# Patient Record
Sex: Female | Born: 1935 | Race: Black or African American | Hispanic: No | Smoking: Never smoker
Health system: Southern US, Community
[De-identification: ages and names within clinical notes are randomized; demographics above are authoritative.]

## PROBLEM LIST (undated history)

## (undated) DIAGNOSIS — H269 Unspecified cataract: Secondary | ICD-10-CM

## (undated) DIAGNOSIS — M1990 Unspecified osteoarthritis, unspecified site: Secondary | ICD-10-CM

## (undated) DIAGNOSIS — T7840XA Allergy, unspecified, initial encounter: Secondary | ICD-10-CM

## (undated) DIAGNOSIS — E559 Vitamin D deficiency, unspecified: Secondary | ICD-10-CM

## (undated) DIAGNOSIS — I10 Essential (primary) hypertension: Secondary | ICD-10-CM

## (undated) DIAGNOSIS — M810 Age-related osteoporosis without current pathological fracture: Secondary | ICD-10-CM

## (undated) DIAGNOSIS — K219 Gastro-esophageal reflux disease without esophagitis: Secondary | ICD-10-CM

## (undated) HISTORY — DX: Essential (primary) hypertension: I10

## (undated) HISTORY — DX: Allergy, unspecified, initial encounter: T78.40XA

## (undated) HISTORY — PX: EYE SURGERY: SHX253

## (undated) HISTORY — DX: Age-related osteoporosis without current pathological fracture: M81.0

## (undated) HISTORY — DX: Unspecified osteoarthritis, unspecified site: M19.90

## (undated) HISTORY — DX: Unspecified cataract: H26.9

## (undated) HISTORY — PX: CHOLECYSTECTOMY: SHX55

## (undated) HISTORY — DX: Vitamin D deficiency, unspecified: E55.9

## (undated) HISTORY — DX: Gastro-esophageal reflux disease without esophagitis: K21.9

---

## 2000-07-17 ENCOUNTER — Other Ambulatory Visit: Admission: RE | Admit: 2000-07-17 | Discharge: 2000-07-17 | Payer: Self-pay | Admitting: Family Medicine

## 2002-01-07 ENCOUNTER — Other Ambulatory Visit: Admission: RE | Admit: 2002-01-07 | Discharge: 2002-01-07 | Payer: Self-pay | Admitting: Family Medicine

## 2002-10-01 ENCOUNTER — Ambulatory Visit (HOSPITAL_COMMUNITY): Admission: RE | Admit: 2002-10-01 | Discharge: 2002-10-01 | Payer: Self-pay | Admitting: Gastroenterology

## 2003-09-28 ENCOUNTER — Observation Stay (HOSPITAL_COMMUNITY): Admission: RE | Admit: 2003-09-28 | Discharge: 2003-09-29 | Payer: Self-pay | Admitting: Surgery

## 2006-07-11 IMAGING — RF DG CHOLANGIOGRAM OPERATIVE
1 series · 16 of 16 positions shown · non-contrast
Comparison: none

CLINICAL DATA: Cholelithiasis; laparoscopic cholecystectomy
INTRAOPERATIVE CHOLANGIOGRAPHY
A sequence of images from the intraoperative cholangiogram demonstrates excellent opacification of the common bile duct, common hepatic duct, and proximal intrahepatic ducts.  No filling defects are identified to suggest retained stones.  There is excellent antegrade flow into the duodenum.  
IMPRESSION
Normal intraoperative cholangiography.

[Series 1: run · 16 of 16 slices shown]
[im 1/16]
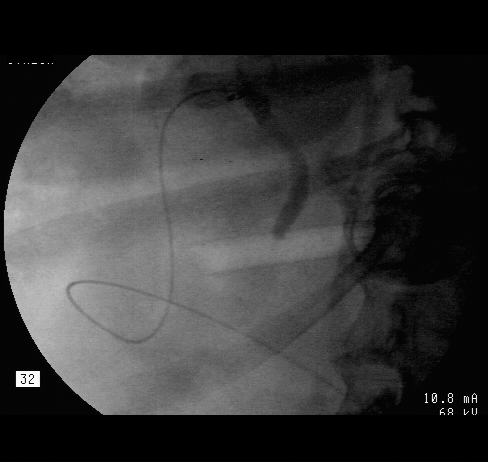
[im 2/16]
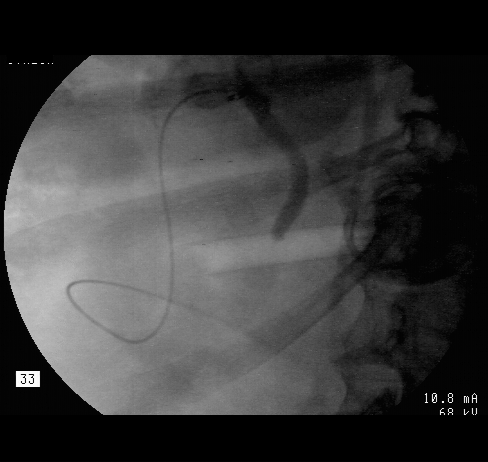
[im 3/16]
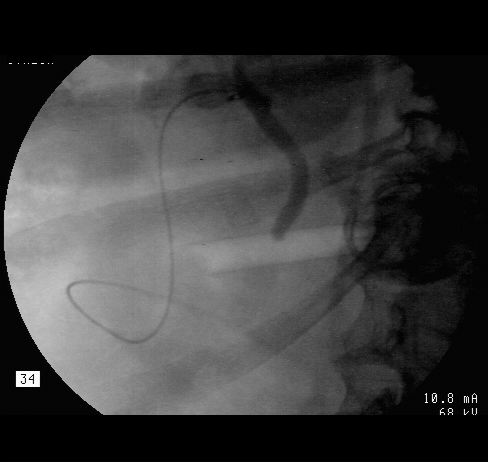
[im 4/16]
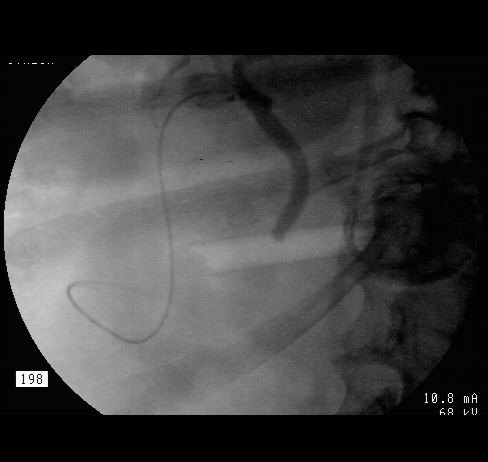
[im 5/16]
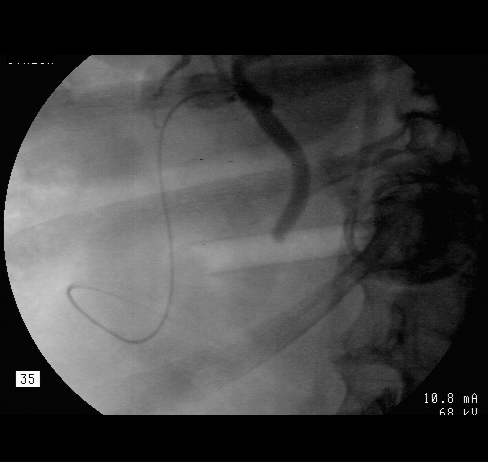
[im 6/16]
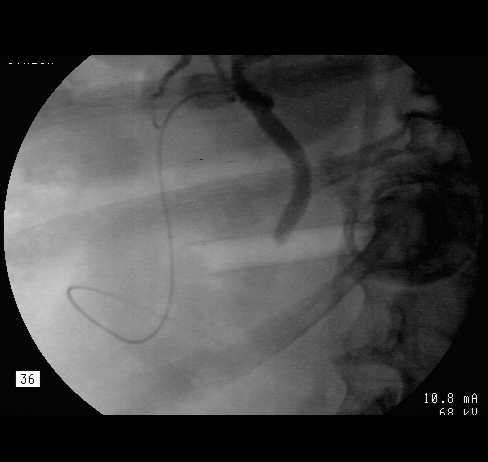
[im 7/16]
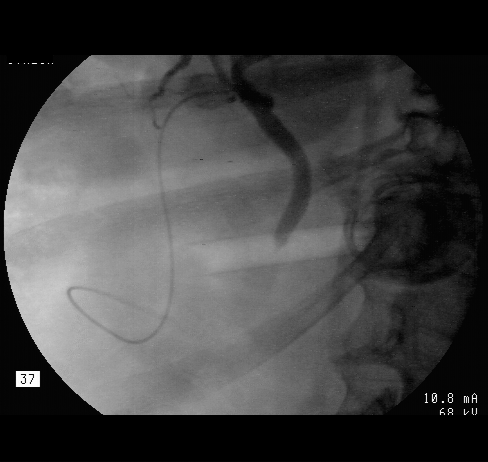
[im 8/16]
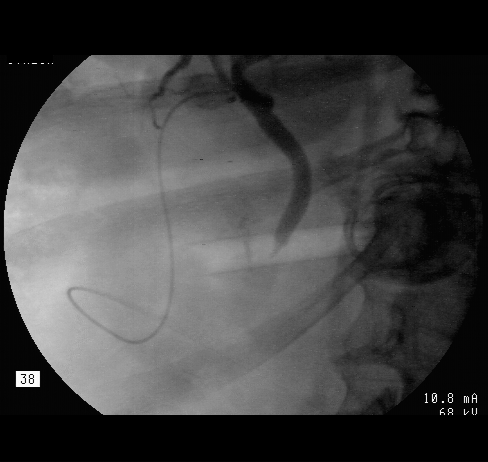
[im 9/16]
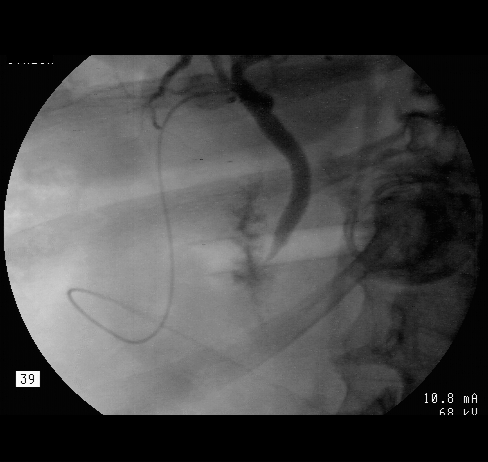
[im 10/16]
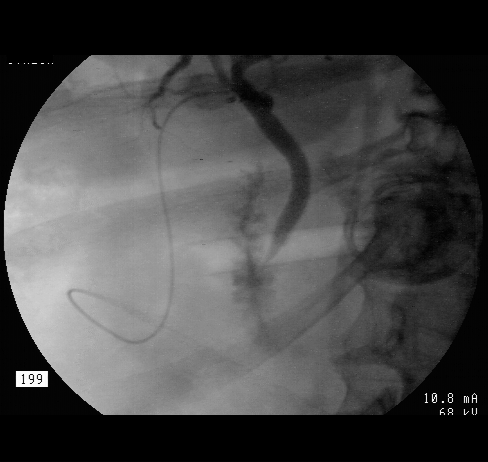
[im 11/16]
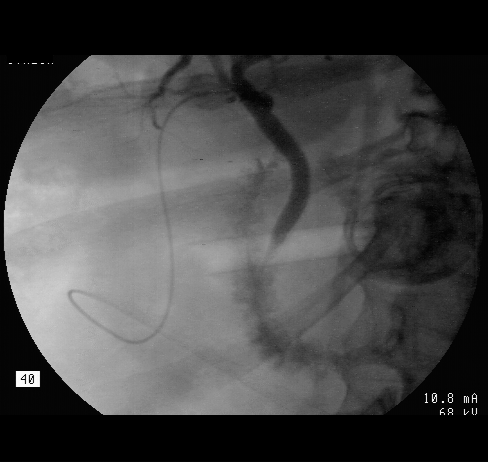
[im 12/16]
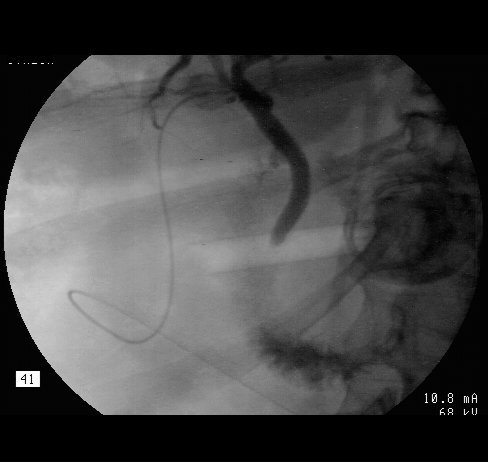
[im 13/16]
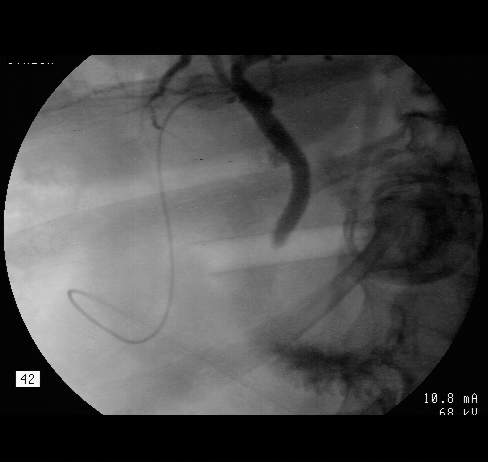
[im 14/16]
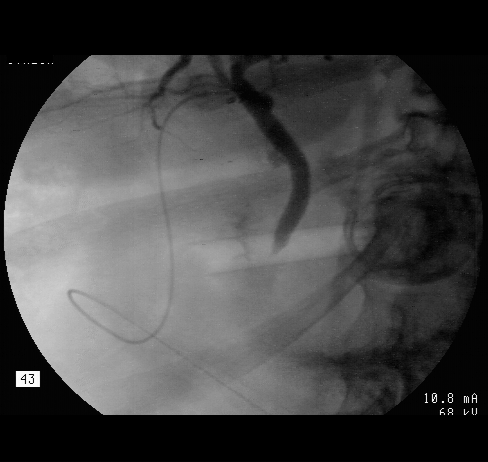
[im 15/16]
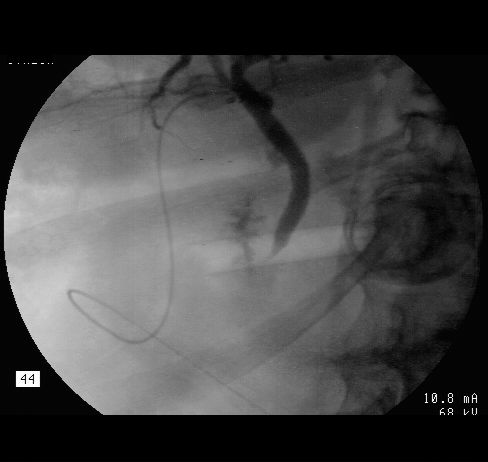
[im 16/16]
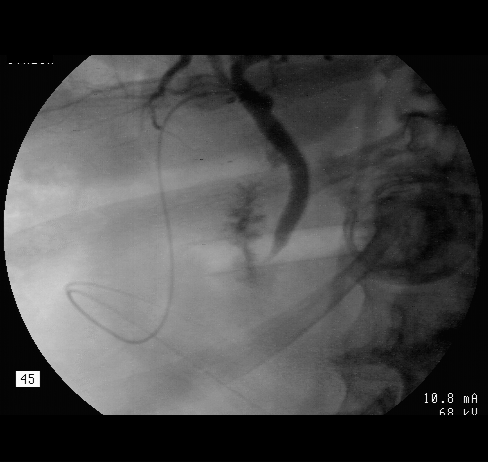

[16 of 16 positions shown; findings below may reference images not displayed]

## 2012-07-31 ENCOUNTER — Other Ambulatory Visit: Payer: Self-pay | Admitting: Family Medicine

## 2012-08-07 ENCOUNTER — Ambulatory Visit
Admission: RE | Admit: 2012-08-07 | Discharge: 2012-08-07 | Disposition: A | Discharge status: Home / Self Care | Payer: Medicare Other | Source: Ambulatory Visit | Attending: Family Medicine | Admitting: Family Medicine

## 2013-01-15 ENCOUNTER — Ambulatory Visit: Payer: Self-pay | Admitting: Neurology

## 2013-06-05 ENCOUNTER — Ambulatory Visit (INDEPENDENT_AMBULATORY_CARE_PROVIDER_SITE_OTHER): Payer: Medicare Other | Admitting: Family Medicine

## 2013-06-05 VITALS — BP 138/82 | HR 92 | Temp 97.9°F | Resp 18 | Ht 62.5 in | Wt 214.0 lb

## 2013-06-05 DIAGNOSIS — J209 Acute bronchitis, unspecified: Secondary | ICD-10-CM

## 2013-06-05 DIAGNOSIS — J329 Chronic sinusitis, unspecified: Secondary | ICD-10-CM

## 2013-06-05 DIAGNOSIS — J069 Acute upper respiratory infection, unspecified: Secondary | ICD-10-CM

## 2013-06-05 DIAGNOSIS — J029 Acute pharyngitis, unspecified: Secondary | ICD-10-CM

## 2013-06-05 MED ORDER — AMOXICILLIN 875 MG PO TABS: 875.0000 mg | ORAL_TABLET | Freq: Two times a day (BID) | ORAL | Status: DC

## 2013-06-05 NOTE — Progress Notes (Signed)
This chart was scribed for Elvina Sidle, MD by Nicholos Johns, Medical Scribe. This patient's care was started at 2:33 PM  Patient ID: Melissa Arnold MRN: [id number], DOB: 03-31-1933, 80 y.o. Date of Encounter: 06/05/2013, 2:33 PM  Primary Physician: No primary provider on file.  Chief Complaint: cough  HPI: 80 y.o. year old female with history below presents with sore throat, HA, rhinorrhea, cough, postnasal drip, congestion, SOB, onset 3 days ago. Is able to sleep but does report waking up when she starts coughing. Pt went to see a doctor on 04/24/13 with similar symptoms and was given Merinax and nose drops that resolved most of the symptoms; did not have a cough at that time. Was told she did not have any infections. 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 Has been in [place] for over 10 years. Pt has 6 children, 10 grandchildren, and 3 great grandchildren.   Past Medical History  Diagnosis Date   Arthritis    Cataract    Hypertension      Home Meds: Prior to Admission medications   Medication Sig Start Date End Date Taking? Authorizing Provider  hydrochlorothiazide (HYDRODIURIL) 25 MG tablet Take 25 mg by mouth daily.   Yes Historical Provider, MD  quinapril (ACCUPRIL) 40 MG tablet Take 40 mg by mouth at bedtime.   Yes Historical Provider, MD    Allergies: No Known Allergies  History   Social History   Marital Status: Married    Spouse Name: N/A    Number of Children: N/A   Years of Education: N/A   Occupational History   Not on file.   Social History Main Topics   Smoking status: Never Smoker    Smokeless tobacco: Not on file   Alcohol Use: No   Drug Use: Not on file   Sexual Activity: Not on file   Other Topics Concern   Not on file   Social History Narrative   No narrative on file     Review of Systems: Constitutional: negative for chills, fever, night sweats, weight changes, or fatigue  HEENT:  negative for vision changes, hearing loss, ST, epistaxis, or sinus pressure. Positive for sore throat, congestion, rhinorrhea, and postnasal drip Cardiovascular: negative for chest pain or palpitations Respiratory: negative for hemoptysis, wheezing. Positive for SOB and cough. Abdominal: negative for abdominal pain, nausea, vomiting, diarrhea, or constipation Dermatological: negative for rash Neurologic: negative for dizziness, or syncope. Positive for headache. All other systems reviewed and are otherwise negative with the exception to those above and in the HPI.   Physical Exam: Blood pressure 138/82, pulse 92, temperature 97.9 F (36.6 C), temperature source Oral, resp. rate 18, height 5' 2.5" (1.588 m), weight 214 lb (97.07 kg), SpO2 97.00%., Body mass index is 38.49 kg/(m^2). General: Well developed, well nourished, in no acute distress. Head: Normocephalic, atraumatic, eyes without discharge, sclera non-icteric, nares are without discharge. Bilateral auditory canals clear, TM's are without perforation, pearly grey and translucent with reflective cone of light bilaterally. Oral cavity moist, posterior pharynx without exudate, erythema, peritonsillar abscess, or post nasal drip.  Neck: Supple. No thyromegaly. Full ROM. No lymphadenopathy. Lungs: Clear bilaterally to auscultation without wheezes, rales, or rhonchi. Breathing is unlabored. Heart: RRR with S1 S2. No murmurs, rubs, or gallops appreciated. Abdomen: Soft, non-tender, non-distended with normoactive bowel sounds. No hepatomegaly. No rebound/guarding. No obvious abdominal masses. Msk:  Strength and tone normal for age. Extremities/Skin: Warm and dry. No clubbing  or cyanosis. No edema. No rashes or suspicious lesions. Neuro: Alert and oriented X 3. Moves all extremities spontaneously. Gait is normal. CNII-XII grossly in tact. Psych:  Responds to questions appropriately with a normal affect.    ASSESSMENT AND PLAN:  80 y.o. year  old female with Sinusitis - Plan: amoxicillin (AMOXIL) 875 MG tablet  Acute bronchitis - Plan: amoxicillin (AMOXIL) 875 MG tablet  Acute pharyngitis - Plan: amoxicillin (AMOXIL) 875 MG tablet     Signed, Elvina Sidle, MD 06/05/2013 2:33 PM

## 2015-06-12 ENCOUNTER — Encounter (HOSPITAL_COMMUNITY): Payer: Self-pay | Admitting: Emergency Medicine

## 2015-06-12 ENCOUNTER — Emergency Department (HOSPITAL_COMMUNITY)
Admission: EM | Admit: 2015-06-12 | Discharge: 2015-06-13 | Disposition: A | Discharge status: Home / Self Care | Payer: Medicare Other | Attending: Emergency Medicine | Admitting: Emergency Medicine

## 2015-06-12 ENCOUNTER — Emergency Department (HOSPITAL_COMMUNITY): Payer: Medicare Other

## 2015-06-12 DIAGNOSIS — R079 Chest pain, unspecified: Secondary | ICD-10-CM

## 2015-06-12 DIAGNOSIS — R61 Generalized hyperhidrosis: Secondary | ICD-10-CM | POA: Insufficient documentation

## 2015-06-12 DIAGNOSIS — M1990 Unspecified osteoarthritis, unspecified site: Secondary | ICD-10-CM | POA: Diagnosis not present

## 2015-06-12 DIAGNOSIS — I10 Essential (primary) hypertension: Secondary | ICD-10-CM | POA: Insufficient documentation

## 2015-06-12 DIAGNOSIS — Z8669 Personal history of other diseases of the nervous system and sense organs: Secondary | ICD-10-CM | POA: Diagnosis not present

## 2015-06-12 DIAGNOSIS — Z79899 Other long term (current) drug therapy: Secondary | ICD-10-CM | POA: Insufficient documentation

## 2015-06-12 LAB — CBC
HEMATOCRIT: 37.5 % (ref 36.0–46.0)
HEMOGLOBIN: 12.6 g/dL (ref 12.0–15.0)
MCH: 26.5 pg (ref 26.0–34.0)
MCHC: 33.6 g/dL (ref 30.0–36.0)
MCV: 78.8 fL (ref 78.0–100.0)
Platelets: 226 10*3/uL (ref 150–400)
RBC: 4.76 MIL/uL (ref 3.87–5.11)
RDW: 14.7 % (ref 11.5–15.5)
WBC: 7.9 10*3/uL (ref 4.0–10.5)

## 2015-06-12 LAB — I-STAT TROPONIN, ED: Troponin i, poc: 0 ng/mL (ref 0.00–0.08)

## 2015-06-12 LAB — BASIC METABOLIC PANEL
Anion gap: 10 (ref 5–15)
BUN: 15 mg/dL (ref 6–20)
CALCIUM: 9.8 mg/dL (ref 8.9–10.3)
CHLORIDE: 101 mmol/L (ref 101–111)
CO2: 29 mmol/L (ref 22–32)
CREATININE: 1.04 mg/dL — AB (ref 0.44–1.00)
GFR calc non Af Amer: 49 mL/min — ABNORMAL LOW (ref >60)
GFR, EST AFRICAN AMERICAN: 57 mL/min — AB (ref >60)
Glucose, Bld: 104 mg/dL — ABNORMAL HIGH (ref 65–99)
Potassium: 3.9 mmol/L (ref 3.5–5.1)
SODIUM: 140 mmol/L (ref 135–145)

## 2015-06-12 NOTE — ED Notes (Signed)
Patient presents for right sided chest pain x2 days. Reports episode of chest pain described as burning and diaphoresis x2 episodes today. Denies SOB, lightheadedness, dizziness, N/V, shoulder or back pain. A&O x4. Ambulatory to registration. Rates pain 5/10.

## 2015-06-13 NOTE — Discharge Instructions (Signed)
Nonspecific Chest Pain Melissa Arnold, your blood work, EKG, and chest xray today were normal.  You need to see your primary care doctor within 3 days for close follow up.  If any of your symptoms return, come back to the ED immediately. Thank you. It is often hard to find the cause of chest pain. There is always a chance that your pain could be related to something serious, such as a heart attack or a blood clot in your lungs. Chest pain can also be caused by conditions that are not life-threatening. If you have chest pain, it is very important to follow up with your doctor.  HOME CARE  If you were prescribed an antibiotic medicine, finish it all even if you start to feel better.  Avoid any activities that cause chest pain.  Do not use any tobacco products, including cigarettes, chewing tobacco, or electronic cigarettes. If you need help quitting, ask your doctor.  Do not drink alcohol.  Take medicines only as told by your doctor.  Keep all follow-up visits as told by your doctor. This is important. This includes any further testing if your chest pain does not go away.  Your doctor may tell you to keep your head raised (elevated) while you sleep.  Make lifestyle changes as told by your doctor. These may include:  Getting regular exercise. Ask your doctor to suggest some activities that are safe for you.  Eating a heart-healthy diet. Your doctor or a diet specialist (dietitian) can help you to learn healthy eating options.  Maintaining a healthy weight.  Managing diabetes, if necessary.  Reducing stress. GET HELP IF:  Your chest pain does not go away, even after treatment.  You have a rash with blisters on your chest.  You have a fever. GET HELP RIGHT AWAY IF:  Your chest pain is worse.  You have an increasing cough, or you cough up blood.  You have severe belly (abdominal) pain.  You feel extremely weak.  You pass out (faint).  You have chills.  You have sudden,  unexplained chest discomfort.  You have sudden, unexplained discomfort in your arms, back, neck, or jaw.  You have shortness of breath at any time.  You suddenly start to sweat, or your skin gets clammy.  You feel nauseous.  You vomit.  You suddenly feel light-headed or dizzy.  Your heart begins to beat quickly, or it feels like it is skipping beats. These symptoms may be an emergency. Do not wait to see if the symptoms will go away. Get medical help right away. Call your local emergency services (911 in the [place] Do not drive yourself to the hospital.   This information is not intended to replace advice given to you by your health care provider. Make sure you discuss any questions you have with your health care provider.   Document Released: 08/21/2007 Document Revised: 03/25/2014 Document Reviewed: 10/08/2013 Elsevier Interactive Patient Education [employer]

## 2015-06-13 NOTE — ED Provider Notes (Addendum)
CSN: [id number]     Arrival date & time 06/12/15  2117 History  By signing my name below, I, Rohini Rajnarayanan, attest that this documentation has been prepared under the direction and in the presence of Tomasita Crumble, MD Electronically Signed: Charlean Merl, ED Scribe 06/13/2015 at 12:19 AM.   Chief Complaint  Patient presents with  . Chest Pain   The history is provided by the patient. No language interpreter was used.   HPI Comments: Melissa Arnold is a 82 y.o. female who presents to the Emergency Department complaining of right sided, hot, burning, mild, intermittent, short-lived, 5/10, right sided CP, and diaphoresis which occurred 2x today. Pt was sitting down, waiting for a prescription to be filled at the drug store earlier today, when the first episode occurred. Then, after driving home, she experienced the second episode around 3 hours later. Pt is experiencing no pain at this time. Pt states no exacerbating or ameliorating factors. Pt admits that she may have a pmhx of heart burn. She denies any emesis, rhinorrhea, back pain, fever, sick contacts, or recent sickness. Pt has no hx of heart problems. Pt does not have a cardiologist at this time. There was no SOB or emesis associated.  Past Medical History  Diagnosis Date  . Arthritis   . Cataract   . Hypertension    Past Surgical History  Procedure Laterality Date  . Cholecystectomy    . Eye surgery     Family History  Problem Relation Age of Onset  . Hypertension Father   . Heart disease Brother    Social History  Substance Use Topics  . Smoking status: Never Smoker   . Smokeless tobacco: None  . Alcohol Use: No   OB History    No data available     Review of Systems  10 Systems reviewed and all are negative for acute change except as noted in the HPI.  Allergies  Review of patient's allergies indicates no known allergies.  Home Medications   Prior to Admission medications   Medication Sig Start Date  End Date Taking? Authorizing Provider  Aspirin-Salicylamide-Caffeine (BC HEADACHE POWDER PO) Take 1 each by mouth daily as needed (pain).   Yes Historical Provider, MD  hydrochlorothiazide (HYDRODIURIL) 25 MG tablet Take 25 mg by mouth daily.   Yes Historical Provider, MD  quinapril (ACCUPRIL) 20 MG tablet Take 20 mg by mouth 2 (two) times daily. 03/16/15  Yes Historical Provider, MD   BP 163/100 mmHg  Pulse 75  Temp(Src) 98.2 F (36.8 C) (Oral)  Resp 16  SpO2 98% Physical Exam  Constitutional: She is oriented to person, place, and time. She appears well-developed and well-nourished. No distress.  HENT:  Head: Normocephalic and atraumatic.  Nose: Nose normal.  Mouth/Throat: Oropharynx is clear and moist. No oropharyngeal exudate.  Eyes: Conjunctivae and EOM are normal. Pupils are equal, round, and reactive to light. No scleral icterus.  Neck: Normal range of motion. Neck supple. No JVD present. No tracheal deviation present. No thyromegaly present.  Cardiovascular: Normal rate, regular rhythm and normal heart sounds.  Exam reveals no gallop and no friction rub.   No murmur heard. Pulmonary/Chest: Effort normal and breath sounds normal. No respiratory distress. She has no wheezes. She exhibits no tenderness.  Abdominal: Soft. Bowel sounds are normal. She exhibits no distension and no mass. There is no tenderness. There is no rebound and no guarding.  Musculoskeletal: Normal range of motion. She exhibits no edema or tenderness.  Lymphadenopathy:  She has no cervical adenopathy.  Neurological: She is alert and oriented to person, place, and time. No cranial nerve deficit. She exhibits normal muscle tone.  Skin: Skin is warm and dry. No rash noted. No erythema. No pallor.  Nursing note and vitals reviewed.   ED Course  Procedures  DIAGNOSTIC STUDIES: Oxygen Saturation is 98% on RA, normal by my interpretation.    COORDINATION OF CARE: 12:16 AM-Discussed treatment plan which  includes DG Chest, blood work, cardiac monitoring, EKG, and Troponin, with pt at bedside and pt agreed to plan.   Labs Review Labs Reviewed  BASIC METABOLIC PANEL - Abnormal; Notable for the following:    Glucose, Bld 104 (*)    Creatinine, Ser 1.04 (*)    GFR calc non Af Amer 49 (*)    GFR calc Af Amer 57 (*)    All other components within normal limits  CBC  I-STAT TROPOININ, ED    Imaging Review Dg Chest 2 View  06/12/2015  CLINICAL DATA:  Chest pain EXAM: CHEST  2 VIEW COMPARISON:  09/26/2003 chest radiograph. FINDINGS: Stable cardiomediastinal silhouette with normal heart size and mildly tortuous thoracic aorta. No pneumothorax. No pleural effusion. Lungs appear clear, with no acute consolidative airspace disease and no pulmonary edema. Stable small eventrations in the anterior hemidiaphragms bilaterally. IMPRESSION: No active cardiopulmonary disease. Electronically Signed   By: Delbert Phenix M.D.   On: 06/12/2015 21:50   I have personally reviewed and evaluated these images and lab results as part of my medical decision-making.   EKG Interpretation   Date/Time:  Monday June 12 2015 21:26:26 EDT Ventricular Rate:  75 PR Interval:  226 QRS Duration: 89 QT Interval:  393 QTC Calculation: 439 R Axis:   -31 Text Interpretation:  Sinus rhythm Prolonged PR interval Abnormal R-wave  progression, late transition Baseline wander in lead(s) V3 No significant  change since last tracing Confirmed by Erroll Luna [id number]) on  06/12/2015 11:26:42 PM      MDM   Final diagnoses:  None   Patient presents to the ED for chest pain. Her history is not consistent with ACS. She has been experiencing this for 2 days and troponin today is still negative.  EKG is unchanged from previous and CXR is unremarkable.  She currently has no symptoms and no cardiac history.  She appears well and in NAD.  She has an appt with PCP in 2 days for close follow up.  Strict return precautions given.  VS  remain within her normal limits and she is safe for DC.   I personally performed the services described in this documentation, which was scribed in my presence. The recorded information has been reviewed and is accurate.       Tomasita Crumble, MD 06/13/15 [id number]  Tomasita Crumble, MD 06/22/15 [phone]

## 2015-06-19 ENCOUNTER — Encounter: Payer: Self-pay | Admitting: *Deleted

## 2015-06-19 ENCOUNTER — Encounter: Payer: Self-pay | Admitting: Physician Assistant

## 2015-06-19 ENCOUNTER — Ambulatory Visit (INDEPENDENT_AMBULATORY_CARE_PROVIDER_SITE_OTHER): Payer: Medicare Other | Admitting: Physician Assistant

## 2015-06-19 VITALS — BP 115/60 | HR 88 | Ht 62.5 in | Wt 224.8 lb

## 2015-06-19 DIAGNOSIS — R0789 Other chest pain: Secondary | ICD-10-CM

## 2015-06-19 DIAGNOSIS — I10 Essential (primary) hypertension: Secondary | ICD-10-CM

## 2015-06-19 NOTE — Progress Notes (Signed)
Cardiology Office Note   Date:  06/19/2015   ID:  Melissa Arnold, DOB [birth year], MRN [id number]  PCP:  Mickie Hillier, MD  Cardiologist:  Dr. Eldridge Dace  Chief Complaint  Patient presents with  . Follow-up    seen for Dr. Eldridge Dace, chest pain      History of Present Illness: Melissa Arnold is a 82 y.o. female who presents for post hospital follow-up. The patient was seen on 06/13/3015 in the emergency room for evaluation of chest pain. According to the patient, she was seen by Dr. Eldridge Dace in August 2014 for evaluation of chest pain, a chemical stress test was done the time which was negative.   She was in her usual status of health until earlier past week, her husband recently was discharged after being admitted for one week. She was under a lot of stress. Last Monday, while waiting in the pharmacy, she has significant diaphoresis accompanied by substernal chest pain lasted about a minute. After going home, she had another episode of chest pain which also lasted roughly 1 minute with significant diaphoresis. She sought medical attention at the walk-in clinic at [facility]. She was sent to the [facility] ED. Workup was negative at the time she was discharged home. She was seen by her PCP last Thursday who recommended follow-up with cardiology for further evaluation. She presented today for cardiology evaluation, she denies any recurrent chest discomfort since last Monday. She is fairly active at home, however cannot walk on treadmill due to her knee problem. EKG obtained today show normal sinus rhythm without significant ST-T wave changes. Her chest discomfort is somewhat atypical, unfortunate she cannot walk on the treadmill at this time, we will order a [employer].     Past Medical History  Diagnosis Date  . Arthritis   . Cataract   . Hypertension   . GERD (gastroesophageal reflux disease)   . Vitamin D deficiency     Past Surgical History  Procedure  Laterality Date  . Cholecystectomy    . Eye surgery       Current Outpatient Prescriptions  Medication Sig Dispense Refill  . aspirin EC 81 MG tablet Take 81 mg by mouth daily.    . Aspirin-Salicylamide-Caffeine (BC HEADACHE POWDER PO) Take 1 each by mouth daily as needed (pain).    . hydrochlorothiazide (HYDRODIURIL) 25 MG tablet Take 25 mg by mouth daily.    . quinapril (ACCUPRIL) 20 MG tablet Take 20 mg by mouth 2 (two) times daily.  1  . traMADol (ULTRAM) 50 MG tablet Take 50 mg by mouth every 12 (twelve) hours as needed for moderate pain.      No current facility-administered medications for this visit.    Allergies:   Gabapentin    Social History:  The patient  reports that she has never smoked. She does not have any smokeless tobacco history on file. She reports that she does not drink alcohol or use illicit drugs.   Family History:  The patient's family history includes Asthma in her mother; Heart disease in her brother; Heart failure in her father and mother; Hypertension in her father. There is no history of Heart attack or Stroke.    ROS:  Please see the history of present illness.   Otherwise, review of systems are positive for Intermittent chest discomfort, diaphoresis.   All other systems are reviewed and negative.    PHYSICAL EXAM: VS:  BP 115/60 mmHg  Pulse 88  Ht  5' 2.5" (1.588 m)  Wt 224 lb 12.8 oz (101.969 kg)  BMI 40.44 kg/m2 , BMI Body mass index is 40.44 kg/(m^2). GEN: Well nourished, well developed, in no acute distress HEENT: normal Neck: no JVD, carotid bruits, or masses Cardiac: RRR; no murmurs, rubs, or gallops,no edema  Respiratory:  clear to auscultation bilaterally, normal work of breathing GI: soft, nontender, nondistended, + BS MS: no deformity or atrophy Skin: warm and dry, no rash Neuro:  Strength and sensation are intact Psych: euthymic mood, full affect   EKG:  EKG is ordered today. The ekg ordered today demonstrates normal sinus  rhythm without significant ST-T wave changes   Recent Labs: 06/12/2015: BUN 15; Creatinine, Ser 1.04*; Hemoglobin 12.6; Platelets 226; Potassium 3.9; Sodium 140    Lipid Panel No results found for: CHOL, TRIG, HDL, CHOLHDL, VLDL, LDLCALC, LDLDIRECT    Wt Readings from Last 3 Encounters:  06/19/15 224 lb 12.8 oz (101.969 kg)  06/05/13 214 lb (97.07 kg)      Other studies Reviewed: Additional studies/ records that were reviewed today include:   Office note by Dr. Catha Gosselin.  . Review of the above records demonstrates:   Recent negative ED workup, EKG showedsignificant ST-T wave changes.    ASSESSMENT AND PLAN:  1.  Atypical chest discomfort: No obvious correlation with exertion, unclear what is the cause of her symptom, her EKG has not shown significant changes, per her report she had a negative Myoview in 2014. Unfortunately she could not ambulate on the treadmill, we'll obtain outpatient Myoview, if negative, would not expect any further workup. Add daily baby aspirin.  2. Hypertension: Blood pressure well-controlled on home medication.    Current medicines are reviewed at length with the patient today.  The patient does not have concerns regarding medicines.  The following changes have been made:  no change  Labs/ tests ordered today include:   Orders Placed This Encounter  Procedures  . Myocardial Perfusion Imaging  . EKG 12-Lead     Disposition:   FU with cardiology depend on Myoview result, he Myoview negative, and follow-up with cardiology on an as-needed basis, if positive, she will need closer follow-up.    Ramond Dial, [place]  06/19/2015 6:57 PM    [facility] [address] [place], [place], [place]  [address] Phone: [phone]; Fax: [phone]

## 2015-06-19 NOTE — Patient Instructions (Addendum)
Medication Instructions:   START TAKING ASPRIN 81 MG ONCE A DAY   If you need a refill on your cardiac medications before your next appointment, please call your pharmacy.  Labwork:  NONE ORDER TODAY    Testing/Procedures:  Your physician has requested that you have a lexiscan myoview. For further information please visit [email or web address] Please follow instruction sheet, as given.     Follow-Up:  BASED UPON RESULTS WILL FOLLOW UP WITH AN APP     Any Other Special Instructions Will Be Listed Below (If Applicable).

## 2015-06-22 ENCOUNTER — Telehealth (HOSPITAL_COMMUNITY): Payer: Self-pay | Admitting: *Deleted

## 2015-06-22 ENCOUNTER — Telehealth (HOSPITAL_COMMUNITY): Payer: Self-pay | Admitting: Radiology

## 2015-06-22 NOTE — Telephone Encounter (Signed)
Patient given detailed instructions per Myocardial Perfusion Study Information Sheet for the test on 06/27/2015 at 7:45. Patient notified to arrive 15 minutes early and that it is imperative to arrive on time for appointment to keep from having the test rescheduled.  If you need to cancel or reschedule your appointment, please call the office within 24 hours of your appointment. Failure to do so may result in a cancellation of your appointment, and a $50 no show fee. Patient verbalized understanding.EHK

## 2015-06-22 NOTE — Telephone Encounter (Signed)
Left message on voicemail in reference to upcoming appointment scheduled for 06/27/15. Phone number given for a call back so details instructions can be given. Melissa Arnold J [name], RN 

## 2015-06-26 ENCOUNTER — Telehealth (HOSPITAL_COMMUNITY): Payer: Self-pay | Admitting: Radiology

## 2015-06-26 ENCOUNTER — Telehealth (HOSPITAL_COMMUNITY): Payer: Self-pay | Admitting: *Deleted

## 2015-06-26 NOTE — Telephone Encounter (Signed)
Patient given detailed instructions per Myocardial Perfusion Study Information Sheet for the test on 06/28/2015 at 7:45. Patient notified to arrive 15 minutes early and that it is imperative to arrive on time for appointment to keep from having the test rescheduled.  If you need to cancel or reschedule your appointment, please call the office within 24 hours of your appointment. Failure to do so may result in a cancellation of your appointment, and a $50 no show fee. Patient verbalized understanding.EHK

## 2015-06-26 NOTE — Telephone Encounter (Signed)
Left message on voicemail in reference to upcoming appointment scheduled for 06/27/15. Phone number given for a call back so details instructions can be given. Melissa Arnold, Melissa Arnold

## 2015-06-27 ENCOUNTER — Ambulatory Visit (HOSPITAL_COMMUNITY): Payer: Medicare Other | Attending: Cardiology

## 2015-06-27 DIAGNOSIS — R0789 Other chest pain: Secondary | ICD-10-CM | POA: Insufficient documentation

## 2015-06-27 DIAGNOSIS — I10 Essential (primary) hypertension: Secondary | ICD-10-CM | POA: Insufficient documentation

## 2015-06-27 MED ORDER — REGADENOSON 0.4 MG/5ML IV SOLN
0.4000 mg | Freq: Once | INTRAVENOUS | Status: AC
  Administered 2015-06-27: 0.4 mg via INTRAVENOUS

## 2015-06-27 MED ORDER — TECHNETIUM TC 99M SESTAMIBI GENERIC - CARDIOLITE
33.0000 | Freq: Once | INTRAVENOUS | Status: AC | PRN
  Administered 2015-06-27: 33 via INTRAVENOUS

## 2015-06-28 ENCOUNTER — Ambulatory Visit (HOSPITAL_COMMUNITY): Payer: Medicare Other | Attending: Cardiovascular Disease

## 2015-06-28 LAB — MYOCARDIAL PERFUSION IMAGING
CHL CUP NUCLEAR SRS: 7
CSEPPHR: 93 {beats}/min
LV dias vol: 61 mL (ref 46–106)
LV sys vol: 10 mL
NUC STRESS TID: 1.16
RATE: 0.31
Rest HR: 81 {beats}/min
SDS: 4
SSS: 11

## 2015-06-28 MED ORDER — TECHNETIUM TC 99M SESTAMIBI GENERIC - CARDIOLITE
32.5000 | Freq: Once | INTRAVENOUS | Status: AC | PRN
  Administered 2015-06-28: 33 via INTRAVENOUS

## 2015-12-20 ENCOUNTER — Ambulatory Visit (INDEPENDENT_AMBULATORY_CARE_PROVIDER_SITE_OTHER): Payer: Medicare Other | Admitting: Family Medicine

## 2015-12-20 VITALS — BP 148/76 | HR 102 | Temp 97.9°F | Resp 20 | Ht 61.0 in | Wt 225.2 lb

## 2015-12-20 DIAGNOSIS — J209 Acute bronchitis, unspecified: Secondary | ICD-10-CM | POA: Diagnosis not present

## 2015-12-20 DIAGNOSIS — J309 Allergic rhinitis, unspecified: Secondary | ICD-10-CM | POA: Insufficient documentation

## 2015-12-20 MED ORDER — FLUTICASONE PROPIONATE 50 MCG/ACT NA SUSP: 2.0000 | Freq: Every day | NASAL | 11 refills | Status: DC

## 2015-12-20 MED ORDER — AZITHROMYCIN 250 MG PO TABS: ORAL_TABLET | ORAL | 0 refills | Status: DC

## 2015-12-20 NOTE — Progress Notes (Signed)
   HPI  Patient presents today here with cough abd congestion  Pt c/o mild dyspnea and productive cough for 3-4 weeks. Preceded by congestion, post nasal drip, and frequent throat clearing.   Tolerating foods and fluids well, denies chest pain.   She denies sinus pain or pressure  PMH: Smoking status noted ROS: Per HPI  Objective: BP (!) 148/76 (BP Location: Left Arm, Patient Position: Sitting, Cuff Size: Large)   Pulse (!) 102   Temp 97.9 F (36.6 C) (Oral)   Resp 20   Ht 5\' 1"  (1.549 m)   Wt 225 lb 3.2 oz (102.2 kg)   SpO2 97%   BMI 42.55 kg/m  Gen: NAD, alert, cooperative with exam HEENT: NCAT, Tms WNL BL, no sinus tenderness to palp, oropoharynx clear, nares with swollen turbinates BL CV: RRR, good S1/S2, no murmur Resp: CTABL, no wheezes, non-labored Ext: No edema, warm Neuro: Alert and oriented, No gross deficits  Assessment and plan:  # Acute bronchitis, allergic rhinitis Covering cough with azithromycin, although her lung exam is reassuring and I think most of her symptoms are due to post nasal drip Maximize sinus treatment with zyrtec+ Flonase, consider singulair if needed Mucinex DM for cough as well.  Avoid decongestants.   RTC with any concerns  Meds ordered this encounter  Medications  . azithromycin (ZITHROMAX) 250 MG tablet    Sig: Take 2 tablets on day 1 and 1 tablet daily after that    Dispense:  6 tablet    Refill:  0  . fluticasone (FLONASE) 50 MCG/ACT nasal spray    Sig: Place 2 sprays into both nostrils daily.    Dispense:  16 g    Refill:  11    , MD 1:11 PM

## 2015-12-20 NOTE — Patient Instructions (Addendum)
Great to meet you!  Start taking zyrtec once daily (not zyrtec D, plain zyrtec, generic is ok)  Start flonase daily. You can keep going with this after you get better if you can see it is helping  Try Mucinex DM 12 hour  Take all of the antibiotics, they last in your system for 10 days.       IF you received an x-ray today, you will receive an invoice from [facility]. Please contact [facility] at [phone] with questions or concerns regarding your invoice.   IF you received labwork today, you will receive an invoice from [employer]. Please contact [facility] at [phone] with questions or concerns regarding your invoice.   Our billing staff will not be able to assist you with questions regarding bills from these companies.  You will be contacted with the lab results as soon as they are available. The fastest way to get your results is to activate your My Chart account. Instructions are located on the last page of this paperwork. If you have not heard from [place] regarding the results in 2 weeks, please contact this office.

## 2016-02-06 ENCOUNTER — Other Ambulatory Visit: Payer: Self-pay | Admitting: *Deleted

## 2016-02-06 MED ORDER — FLUTICASONE PROPIONATE 50 MCG/ACT NA SUSP: 2.0000 | Freq: Every day | NASAL | 3 refills | Status: DC

## 2016-05-14 ENCOUNTER — Encounter (HOSPITAL_COMMUNITY): Payer: Self-pay | Admitting: Emergency Medicine

## 2016-05-14 ENCOUNTER — Emergency Department (HOSPITAL_COMMUNITY)
Admission: EM | Admit: 2016-05-14 | Discharge: 2016-05-15 | Disposition: A | Discharge status: Home / Self Care | Payer: Medicare Other | Attending: Emergency Medicine | Admitting: Emergency Medicine

## 2016-05-14 ENCOUNTER — Emergency Department (HOSPITAL_COMMUNITY): Payer: Medicare Other

## 2016-05-14 DIAGNOSIS — Z7982 Long term (current) use of aspirin: Secondary | ICD-10-CM | POA: Insufficient documentation

## 2016-05-14 DIAGNOSIS — M79602 Pain in left arm: Secondary | ICD-10-CM | POA: Diagnosis not present

## 2016-05-14 DIAGNOSIS — R0600 Dyspnea, unspecified: Secondary | ICD-10-CM | POA: Diagnosis not present

## 2016-05-14 DIAGNOSIS — I10 Essential (primary) hypertension: Secondary | ICD-10-CM | POA: Insufficient documentation

## 2016-05-14 DIAGNOSIS — M79601 Pain in right arm: Secondary | ICD-10-CM | POA: Diagnosis not present

## 2016-05-14 DIAGNOSIS — R0602 Shortness of breath: Secondary | ICD-10-CM | POA: Diagnosis present

## 2016-05-14 DIAGNOSIS — M79603 Pain in arm, unspecified: Secondary | ICD-10-CM

## 2016-05-14 LAB — I-STAT TROPONIN, ED: Troponin i, poc: 0.01 ng/mL (ref 0.00–0.08)

## 2016-05-14 LAB — CBC WITH DIFFERENTIAL/PLATELET
BASOS PCT: 0 %
Basophils Absolute: 0 10*3/uL (ref 0.0–0.1)
Eosinophils Absolute: 0.1 10*3/uL (ref 0.0–0.7)
Eosinophils Relative: 2 %
HEMATOCRIT: 38.6 % (ref 36.0–46.0)
Hemoglobin: 12.6 g/dL (ref 12.0–15.0)
LYMPHS ABS: 3 10*3/uL (ref 0.7–4.0)
LYMPHS PCT: 35 %
MCH: 25.7 pg — AB (ref 26.0–34.0)
MCHC: 32.6 g/dL (ref 30.0–36.0)
MCV: 78.6 fL (ref 78.0–100.0)
MONO ABS: 0.7 10*3/uL (ref 0.1–1.0)
MONOS PCT: 8 %
NEUTROS ABS: 4.7 10*3/uL (ref 1.7–7.7)
Neutrophils Relative %: 55 %
Platelets: 227 10*3/uL (ref 150–400)
RBC: 4.91 MIL/uL (ref 3.87–5.11)
RDW: 15.1 % (ref 11.5–15.5)
WBC: 8.5 10*3/uL (ref 4.0–10.5)

## 2016-05-14 LAB — BASIC METABOLIC PANEL
ANION GAP: 8 (ref 5–15)
BUN: 13 mg/dL (ref 6–20)
CALCIUM: 9.7 mg/dL (ref 8.9–10.3)
CO2: 27 mmol/L (ref 22–32)
Chloride: 105 mmol/L (ref 101–111)
Creatinine, Ser: 0.81 mg/dL (ref 0.44–1.00)
GFR calc Af Amer: >60 mL/min (ref >60)
GFR calc non Af Amer: >60 mL/min (ref >60)
GLUCOSE: 110 mg/dL — AB (ref 65–99)
Potassium: 3.5 mmol/L (ref 3.5–5.1)
Sodium: 140 mmol/L (ref 135–145)

## 2016-05-14 LAB — BRAIN NATRIURETIC PEPTIDE: B Natriuretic Peptide: 23.7 pg/mL (ref 0.0–100.0)

## 2016-05-14 MED ORDER — ASPIRIN 81 MG PO CHEW
324.0000 mg | CHEWABLE_TABLET | Freq: Once | ORAL | Status: AC
  Administered 2016-05-14: 324 mg via ORAL
  Filled 2016-05-14: qty 4

## 2016-05-14 NOTE — ED Provider Notes (Signed)
[facility]-EMERGENCY DEPT Provider Note   CSN: [id number] Arrival date & time: 05/14/16  1814     History   Chief Complaint Chief Complaint  Patient presents with  . Shortness of Breath  . pain in arm  . Back Pain    HPI Melissa Arnold is a 83 y.o. female.  She complains of episodes of dyspnea, episodes of pain in her back and left arm, and episodes of pain in the muscles of her right arm. These have been occurring over the last 3 days. Symptoms have not been getting worse. The 3 separate complaints seem to be completely independent of each other. Dyspnea is not related to position or exertion. It seems to bother her more in the afternoon and evening. She has not had any nocturnal symptoms. The back and left arm pain do not seem to be related to any activity or body position. The pain in the muscle of the right arm does seem to come when she is walking and better when she sits down and rests. This pain is associated with some episodes of diaphoresis. She denies any nausea or vomiting. She does not have dyspnea at the same time she has the pain in the muscles of the right arm. She does have history of hypertension but no history of diabetes or hyperlipidemia. She is a nonsmoker.   The history is provided by the patient.  Shortness of Breath   Back Pain      Past Medical History:  Diagnosis Date  . Arthritis   . Cataract   . GERD (gastroesophageal reflux disease)   . Hypertension   . Vitamin D deficiency     Patient Active Problem List   Diagnosis Date Noted  . Acute allergic rhinitis 12/20/2015    Past Surgical History:  Procedure Laterality Date  . CHOLECYSTECTOMY    . EYE SURGERY      OB History    No data available       Home Medications    Prior to Admission medications   Medication Sig Start Date End Date Taking? Authorizing Provider  Aspirin-Salicylamide-Caffeine (BC HEADACHE POWDER PO) Take 1 each by mouth daily as needed (pain).   Yes Historical  Provider, MD  hydrochlorothiazide (HYDRODIURIL) 25 MG tablet Take 25 mg by mouth daily.   Yes Historical Provider, MD  quinapril (ACCUPRIL) 20 MG tablet Take 20 mg by mouth 2 (two) times daily. 03/16/15  Yes Historical Provider, MD  azithromycin (ZITHROMAX) 250 MG tablet Take 2 tablets on day 1 and 1 tablet daily after that Patient not taking: Reported on 05/14/2016 12/20/15   Elenora Gamma, MD  fluticasone [facility]) 50 MCG/ACT nasal spray Place 2 sprays into both nostrils daily. Patient not taking: Reported on 05/14/2016 02/06/16   Elenora Gamma, MD    Family History Family History  Problem Relation Age of Onset  . Hypertension Father   . Heart failure Father   . Heart disease Brother   . Heart failure Mother   . Asthma Mother   . Heart attack Neg Hx   . Stroke Neg Hx     Social History Social History  Substance Use Topics  . Smoking status: Never Smoker  . Smokeless tobacco: Never Used  . Alcohol use No     Allergies   Gabapentin   Review of Systems Review of Systems  Respiratory: Positive for shortness of breath.   Musculoskeletal: Positive for back pain.  All other systems reviewed and are negative.  Physical Exam Updated Vital Signs BP 152/95 (BP Location: Left Arm)   Pulse 92   Temp 98.9 F (37.2 C) (Oral)   Resp 12   SpO2 97%   Physical Exam  Nursing note and vitals reviewed.  83 year old female, resting comfortably and in no acute distress. Vital signs are significant for hypertension. Oxygen saturation is 97%, which is normal. Head is normocephalic and atraumatic. PERRLA, EOMI. Oropharynx is clear. Neck is nontender and supple without adenopathy or JVD. Back is nontender and there is no CVA tenderness. Lungs are clear without rales, wheezes, or rhonchi. Chest is nontender. Heart has regular rate and rhythm without murmur. Abdomen is soft, flat, nontender without masses or hepatosplenomegaly and peristalsis is normoactive. Extremities have  1+ edema, full range of motion is present. Skin is warm and dry without rash. Neurologic: Mental status is normal, cranial nerves are intact, there are no motor or sensory deficits.  ED Treatments / Results  Labs (all labs ordered are listed, but only abnormal results are displayed) Labs Reviewed  BASIC METABOLIC PANEL - Abnormal; Notable for the following:       Result Value   Glucose, Bld 110 (*)    All other components within normal limits  CBC WITH DIFFERENTIAL/PLATELET - Abnormal; Notable for the following:    MCH 25.7 (*)    All other components within normal limits  BRAIN NATRIURETIC PEPTIDE  I-STAT TROPOININ, ED    EKG  EKG Interpretation  Date/Time:  Tuesday May 14 2016 18:35:14 EST Ventricular Rate:  94 PR Interval:    QRS Duration: 86 QT Interval:  352 QTC Calculation: 441 R Axis:   -22 Text Interpretation:  Sinus rhythm Prolonged PR interval Borderline left axis deviation Low voltage, precordial leads Borderline T abnormalities, anterior leads When compared with ECG of 06/12/2015, No significant change was found Confirmed by [facility]  MD, Brogan Martis ([id number]) on 05/14/2016 6:39:56 PM       Radiology Dg Chest 2 View  Result Date: 05/14/2016 CLINICAL DATA:  C/o sob x 3 days. Hx htn. Non smoker EXAM: CHEST  2 VIEW COMPARISON:  06/12/2015 FINDINGS: Lateral view degraded by patient arm position. Mild thoracic spondylosis. Cholecystectomy. Midline trachea. Normal heart size. Tortuous thoracic aorta. No pleural effusion or pneumothorax. Clear lungs. IMPRESSION: No acute cardiopulmonary disease. Electronically Signed   By: Jeronimo Greaves M.D.   On: 05/14/2016 19:07    Procedures Procedures (including critical care time)  Medications Ordered in ED Medications  aspirin chewable tablet 324 mg (not administered)     Initial Impression / Assessment and Plan / ED Course  I have reviewed the triage vital signs and the nursing notes.  Pertinent labs & imaging results that were  available during my care of the patient were reviewed by me and considered in my medical decision making (see chart for details).  Dyspnea of uncertain cause. She does have some peripheral edema, but chest x-ray shows no signs of congestive heart failure. Will check BNP. Her back and left arm pain of uncertain cause. This does not appear to be musculoskeletal, but is not exertional and does not seem to be cardiac. Right arm muscle pain does seem to be related to exertion. Old records were reviewed, and she did have a negative stress Myoview study last April. I'm still concerned about possibility of cardiac etiology. Will check troponin. ECG shows no acute changes. She is given a dose of aspirin. If laboratory workup is unremarkable, will send to cardiology to consider  repeat nuclear stress testing.  Laboratory workup is unremarkable including normal troponin and normal BNP. Findings have been discussed with patient including need for cardiology evaluation and possible repeat stress testing. She is advised to take 1 baby aspirin a day until she sees her cardiologist. Return cautions discussed.  Final Clinical Impressions(s) / ED Diagnoses   Final diagnoses:  Dyspnea, unspecified type  Pain of upper extremity, unspecified laterality    New Prescriptions New Prescriptions   No medications on file     Dione Booze, MD 05/15/16 [phone]

## 2016-05-15 NOTE — Discharge Instructions (Signed)
Take one baby aspirin (81 mg) every day until you see the cardiologist. Return if symptoms are getting worse.

## 2016-06-27 ENCOUNTER — Other Ambulatory Visit: Payer: Self-pay | Admitting: Family Medicine

## 2016-06-27 DIAGNOSIS — R42 Dizziness and giddiness: Secondary | ICD-10-CM

## 2016-07-02 ENCOUNTER — Ambulatory Visit
Admission: RE | Admit: 2016-07-02 | Discharge: 2016-07-02 | Disposition: A | Discharge status: Home / Self Care | Payer: Medicare Other | Source: Ambulatory Visit | Attending: Family Medicine | Admitting: Family Medicine

## 2016-07-02 DIAGNOSIS — R42 Dizziness and giddiness: Secondary | ICD-10-CM

## 2016-07-26 ENCOUNTER — Ambulatory Visit (INDEPENDENT_AMBULATORY_CARE_PROVIDER_SITE_OTHER): Payer: Medicare Other | Admitting: Neurology

## 2016-07-26 ENCOUNTER — Encounter: Payer: Self-pay | Admitting: Neurology

## 2016-07-26 VITALS — BP 119/82 | HR 99 | Ht 61.0 in | Wt 208.5 lb

## 2016-07-26 DIAGNOSIS — R42 Dizziness and giddiness: Secondary | ICD-10-CM | POA: Diagnosis not present

## 2016-07-26 NOTE — Patient Instructions (Signed)
   We will check MRI of the brain and get a carotid doppler study. 

## 2016-07-26 NOTE — Progress Notes (Signed)
Reason for visit: Dizziness, gait instability  Referring physician: Dr. [name] is a 83 y.o. female  History of present illness:  Melissa Arnold is an 83 year old right-handed black female with a history of obesity and hypertension. The patient was last seen through this office in October 2013 with headaches associated with a left occipital neuralgia. The patient has had headaches off and on occurring on average once a week over the years. The patient began having new symptoms about 6 weeks prior to this evaluation, she woke up with headache and true vertigo that occurred when she got up out of bed. The severe vertigo lasted about 10 minutes and then seemed to get better, but the patient has had episodes of lightheaded sensations without true vertigo that may come and go since that time. The episodes generally occur when she is standing up trying to walk, she will have a sensation of veering to the left. The patient has not had any falls. She has started using a quad cane for ambulation because of the gait instability. The episodes will sometimes be associated with headache, sometimes not. The headaches are increasing in frequency and are now occurring about every other day. The headaches are on the top of the head or around the head. The patient denies any double vision or loss of vision or any syncope problems. The patient does have some internittent tingling in the fingers and toes that will come and go, this predated the onset of the above symptoms. She denies any troubles controlling the bowels or the bladder. She has not had any confusion, slurred speech, hearing changes or ringing in the ears or ear pain. The patient has undergone a CT scan of the brain that did not show an acute stroke. She is sent to this office for an evaluation. The patient takes one BC powder daily. She denies any medication changes around the time of onset of symptoms.  Past Medical History:  Diagnosis Date   . Arthritis   . Cataract   . GERD (gastroesophageal reflux disease)   . Hypertension   . Vitamin D deficiency     Past Surgical History:  Procedure Laterality Date  . CHOLECYSTECTOMY    . EYE SURGERY      Family History  Problem Relation Age of Onset  . Hypertension Father   . Heart failure Father   . Heart disease Brother   . Heart failure Mother   . Asthma Mother   . Heart attack Neg Hx   . Stroke Neg Hx     Social history:  reports that she has never smoked. She has never used smokeless tobacco. She reports that she does not drink alcohol or use drugs.  Medications:  Prior to Admission medications   Medication Sig Start Date End Date Taking? Authorizing Provider  Aspirin-Salicylamide-Caffeine (BC HEADACHE POWDER PO) Take 1 each by mouth daily as needed (pain).   Yes [provider]  Cholecalciferol (VITAMIN D3) 50000 units TABS Take by mouth.   Yes [provider]  hydrochlorothiazide (HYDRODIURIL) 25 MG tablet Take 25 mg by mouth daily.   Yes [provider]  montelukast (SINGULAIR) 10 MG tablet Take 10 mg by mouth every evening. 06/27/16  Yes [provider]  quinapril (ACCUPRIL) 20 MG tablet Take 20 mg by mouth 2 (two) times daily. 03/16/15  Yes [provider]  traMADol (ULTRAM) 50 MG tablet TAKE 1 TABLET BY MOUTH 3 TIMES A DAY AS NEEDED FOR  PAIN 06/27/16  Yes [provider]      Allergies  Allergen Reactions  . Gabapentin     WOOZY FEELING    ROS:  Out of a complete 14 system review of symptoms, the patient complains only of the following symptoms, and all other reviewed systems are negative.  Shortness of breath, cough Joint pain, achy muscles Allergies, runny nose Headache, dizziness Decreased energy  Blood pressure 119/82, pulse 99, height 5\' 1"  (1.549 m), weight 208 lb 8 oz (94.6 kg).   Blood pressure, right arm, sitting is 132/88. Blood pressure, right arm, standing is 104/80.  Physical  Exam  General: The patient is alert and cooperative at the time of the examination. The patient is moderately to markedly obese.  Eyes: Pupils are equal, round, and reactive to light. Discs are flat bilaterally.  Ears: Tympanic membranes are clear bilaterally.  Neck: The neck is supple, no carotid bruits are noted.  Respiratory: The respiratory examination is clear.  Cardiovascular: The cardiovascular examination reveals a regular rate and rhythm, no obvious murmurs or rubs are noted.  Skin: Extremities are without significant edema.  Neurologic Exam  Mental status: The patient is alert and oriented x 3 at the time of the examination. The patient has apparent normal recent and remote memory, with an apparently normal attention span and concentration ability.  Cranial nerves: Facial symmetry is present. There is good sensation of the face to pinprick and soft touch bilaterally. The strength of the facial muscles and the muscles to head turning and shoulder shrug are normal bilaterally. Speech is well enunciated, no aphasia or dysarthria is noted. Extraocular movements are full. Visual fields are full. The tongue is midline, and the patient has symmetric elevation of the soft palate. No obvious hearing deficits are noted.  Motor: The motor testing reveals 5 over 5 strength of all 4 extremities. Good symmetric motor tone is noted throughout.  Sensory: Sensory testing is intact to pinprick, soft touch, vibration sensation, and position sense on all 4 extremities. No evidence of extinction is noted.  Coordination: Cerebellar testing reveals good finger-nose-finger and heel-to-shin bilaterally.  Gait and station: Gait is normal. Tandem gait is slightly unsteady. Romberg is negative. No drift is seen.  Reflexes: Deep tendon reflexes are symmetric and normal bilaterally. Toes are downgoing bilaterally.   CT brain 07/02/16:  IMPRESSION: 1. No acute intracranial abnormality identified. 2.  Stable mild for age chronic microvascular ischemic changes and mild parenchymal volume loss of the brain.  * CT scan images were reviewed online. I agree with the written report.    Assessment/Plan:  1. Onset of dizziness, gait instability, headache  The patient does have some risk factors for cerebrovascular disease. The patient presents with true vertigo, but now has episodes of lightheadedness and veering to the left with walking, increased frequency of headache. The patient will be sent for MRI of the brain to exclude a small vessel stroke that could be easily be missed by CT scan. The patient will undergo carotid Doppler study. She is to check her blood pressures at home while symptomatic, the patient dropped 25 points of the systolic blood pressure from sitting to standing in the office today. The patient could potentially have orthostasis as an etiology of her symptoms. She will follow-up in about 3 months. I will contact her concerning the results of the above studies.   07/04/16 MD 07/26/2016 11:52 AM  [facility] [address] [place], [place]  Phone [phone] Fax [phone]  

## 2016-08-11 ENCOUNTER — Other Ambulatory Visit: Payer: Medicare Other

## 2016-08-11 ENCOUNTER — Telehealth: Payer: Self-pay | Admitting: Neurology

## 2016-08-11 ENCOUNTER — Ambulatory Visit
Admission: RE | Admit: 2016-08-11 | Discharge: 2016-08-11 | Disposition: A | Discharge status: Home / Self Care | Payer: Medicare Other | Source: Ambulatory Visit | Attending: Neurology | Admitting: Neurology

## 2016-08-11 DIAGNOSIS — R42 Dizziness and giddiness: Secondary | ICD-10-CM

## 2016-08-11 NOTE — Telephone Encounter (Signed)
  I called patient. The MRI the brain does not show any significant cerebrovascular disease. Minimal white matter changes are seen. There is nothing that would explain a change in balance or problems with dizziness.   The patient is to check blood pressures while standing while symptomatic. A carotid Doppler studies pending, I will call her with the results.  MRI brain 08/09/16:  IMPRESSION:  This MRI of the brain without contrast shows the following: 1.    Mild age-related chronic microvascular ischemic change. None of the foci appeared to be acute though there is slight progression when compared to the 2014 MRI. 2.    Brain volume is normal for age. 3.    No acute findings.

## 2016-08-13 NOTE — Telephone Encounter (Signed)
I called patient. MRI the brain did not show significant white matter changes to explain the dizziness. A carotid Doppler study is pending. The patient will try to check her blood pressure sitting and standing when she gets dizzy at home, she did have some slight orthostasis when seen in office.

## 2016-08-13 NOTE — Telephone Encounter (Signed)
Pt returned Dr Willis call.  °

## 2016-11-06 ENCOUNTER — Encounter: Payer: Self-pay | Admitting: Adult Health

## 2016-11-06 ENCOUNTER — Ambulatory Visit (INDEPENDENT_AMBULATORY_CARE_PROVIDER_SITE_OTHER): Payer: Medicare Other | Admitting: Adult Health

## 2016-11-06 VITALS — BP 131/67 | HR 72 | Ht 61.0 in | Wt 208.4 lb

## 2016-11-06 DIAGNOSIS — R42 Dizziness and giddiness: Secondary | ICD-10-CM

## 2016-11-06 NOTE — Addendum Note (Signed)
Addended by: Enedina Finner on: 11/06/2016 03:39 PM   Modules accepted: Orders

## 2016-11-06 NOTE — Progress Notes (Signed)
I have read the note, and I agree with the clinical assessment and plan.  WILLIS,CHARLES KEITH   

## 2016-11-06 NOTE — Progress Notes (Signed)
PATIENT: Melissa Arnold DOB: [birth year]  REASON FOR VISIT: follow up-  HISTORY FROM: patient  HISTORY OF PRESENT ILLNESS: Today 11/06/16 Melissa Arnold is an 83 year old female with a history of vertigo. She returns today for follow-up. The patient states that she continues to have daily episodes of dizziness. This usually occurs when she is going from a sitting to a standing position. She reports that she will feel slightly lightheaded and then she returns to normal. She states that she never has any episodes of dizziness when she is sitting still. She does not notice any dizziness with head turning or rolling over in bed. She states that she has been checking her blood pressure at home but only when she is sitting. She reports that the systolic number ranges between 90 and 130. She has not been taking it once she stands. The patient did have an MRI of the brain that did not show any significant white matter changes to explain her dizziness. Carotid Dopplers was ordered however the patient has not had these done yet. The patient denies any additional symptoms. She returns today for an evaluation.  HISTORY 07/26/16: Melissa Arnold is an 83 year old right-handed black female with a history of obesity and hypertension. The patient was last seen through this office in October 2013 with headaches associated with a left occipital neuralgia. The patient has had headaches off and on occurring on average once a week over the years. The patient began having new symptoms about 6 weeks prior to this evaluation, she woke up with headache and true vertigo that occurred when she got up out of bed. The severe vertigo lasted about 10 minutes and then seemed to get better, but the patient has had episodes of lightheaded sensations without true vertigo that may come and go since that time. The episodes generally occur when she is standing up trying to walk, she will have a sensation of veering to the left. The patient has not  had any falls. She has started using a quad cane for ambulation because of the gait instability. The episodes will sometimes be associated with headache, sometimes not. The headaches are increasing in frequency and are now occurring about every other day. The headaches are on the top of the head or around the head. The patient denies any double vision or loss of vision or any syncope problems. The patient does have some internittent tingling in the fingers and toes that will come and go, this predated the onset of the above symptoms. She denies any troubles controlling the bowels or the bladder. She has not had any confusion, slurred speech, hearing changes or ringing in the ears or ear pain. The patient has undergone a CT scan of the brain that did not show an acute stroke. She is sent to this office for an evaluation. The patient takes one BC powder daily. She denies any medication changes around the time of onset of symptoms.  REVIEW OF SYSTEMS: Out of a complete 14 system review of symptoms, the patient complains only of the following symptoms, and all other reviewed systems are negative.  Dizziness, headache, agitation, joint pain, back pain, walking difficulty, excessive sweating  ALLERGIES: Allergies  Allergen Reactions  . Gabapentin     WOOZY FEELING    HOME MEDICATIONS: Outpatient Medications Prior to Visit  Medication Sig Dispense Refill  . Aspirin-Salicylamide-Caffeine (BC HEADACHE POWDER PO) Take 1 each by mouth daily as needed (pain).    . Cholecalciferol (VITAMIN D3) 50000 units  TABS Take by mouth.    . hydrochlorothiazide (HYDRODIURIL) 25 MG tablet Take 25 mg by mouth daily.    . montelukast (SINGULAIR) 10 MG tablet Take 10 mg by mouth every evening.  5  . quinapril (ACCUPRIL) 20 MG tablet Take 20 mg by mouth 2 (two) times daily.  1  . traMADol (ULTRAM) 50 MG tablet TAKE 1 TABLET BY MOUTH 3 TIMES A DAY AS NEEDED FOR PAIN  0   No facility-administered medications prior to visit.      PAST MEDICAL HISTORY: Past Medical History:  Diagnosis Date  . Arthritis   . Cataract   . GERD (gastroesophageal reflux disease)   . Hypertension   . Vitamin D deficiency     PAST SURGICAL HISTORY: Past Surgical History:  Procedure Laterality Date  . CHOLECYSTECTOMY    . EYE SURGERY      FAMILY HISTORY: Family History  Problem Relation Age of Onset  . Hypertension Father   . Heart failure Father   . Heart disease Brother   . Heart failure Mother   . Asthma Mother   . Heart attack Neg Hx   . Stroke Neg Hx     SOCIAL HISTORY: Social History   Social History  . Marital status: Married    Spouse name: N/A  . Number of children: N/A  . Years of education: N/A   Occupational History  . Not on file.   Social History Main Topics  . Smoking status: Never Smoker  . Smokeless tobacco: Never Used  . Alcohol use No  . Drug use: No  . Sexual activity: Not on file   Other Topics Concern  . Not on file   Social History Narrative  . No narrative on file      PHYSICAL EXAM  Vitals:   11/06/16 0726  BP: 131/67  Pulse: 72  Weight: 208 lb 6.4 oz (94.5 kg)  Height: 5\' 1"  (1.549 m)   Body mass index is 39.38 kg/m.  Generalized: Well developed, in no acute distress   Neurological examination  Mentation: Alert oriented to time, place, history taking. Follows all commands speech and language fluent Cranial nerve II-XII: Pupils were equal round reactive to light. Extraocular movements were full, visual field were full on confrontational test. Facial sensation and strength were normal. Uvula tongue midline. Head turning and shoulder shrug  were normal and symmetric. Motor: The motor testing reveals 5 over 5 strength of all 4 extremities. Good symmetric motor tone is noted throughout.  Sensory: Sensory testing is intact to soft touch on all 4 extremities. No evidence of extinction is noted.  Coordination: Cerebellar testing reveals good finger-nose-finger and  heel-to-shin bilaterally.  Gait and station: Patient uses a cane when ambulating. Tandem gait not attempted.  DIAGNOSTIC DATA (LABS, IMAGING, TESTING) - I reviewed patient records, labs, notes, testing and imaging myself where available.  Lab Results  Component Value Date   WBC 8.5 05/14/2016   HGB 12.6 05/14/2016   HCT 38.6 05/14/2016   MCV 78.6 05/14/2016   PLT 227 05/14/2016      Component Value Date/Time   NA 140 05/14/2016 2243   K 3.5 05/14/2016 2243   CL 105 05/14/2016 2243   CO2 27 05/14/2016 2243   GLUCOSE 110 (H) 05/14/2016 2243   BUN 13 05/14/2016 2243   CREATININE 0.81 05/14/2016 2243   CALCIUM 9.7 05/14/2016 2243   GFRNONAA >60 05/14/2016 2243   GFRAA >60 05/14/2016 2243      ASSESSMENT  AND PLAN 83 y.o. year old female  has a past medical history of Arthritis; Cataract; GERD (gastroesophageal reflux disease); Hypertension; and Vitamin D deficiency. here with:  1. Dizziness  The patient continues to have daily episodes of dizziness. This usually occurs when she transfers from a sitting to a standing position. The patient will be scheduled for carotid Doppler studies. She should check her blood pressure when she is sitting and standing at home. She is advised that if her symptoms worsen or she develops new symptoms she she'll let us know.  I spent 15 minutes with the patient. 50% of this time was spent discussing carotid doppler test and symptoms   Butch Penny, MSN, NP-C 11/06/2016, 7:24 AM [facility] [address], [address] [place], [place] [address] [phone]

## 2016-11-06 NOTE — Patient Instructions (Signed)
Your Plan:  Carotid dopplers will be scheduled Check blood pressure sitting and standing If your symptoms worsen or you develop new symptoms please let us know.   Thank you for coming to see [place] at [facility]. I hope we have been able to provide you high quality care today.  You may receive a patient satisfaction survey over the next few weeks. We would appreciate your feedback and comments so that we may continue to improve ourselves and the health of our patients.

## 2017-05-14 ENCOUNTER — Ambulatory Visit: Payer: Medicare Other | Admitting: Adult Health

## 2017-05-20 ENCOUNTER — Ambulatory Visit (INDEPENDENT_AMBULATORY_CARE_PROVIDER_SITE_OTHER): Payer: Medicare Other | Admitting: Physician Assistant

## 2017-05-20 ENCOUNTER — Encounter: Payer: Self-pay | Admitting: Physician Assistant

## 2017-05-20 ENCOUNTER — Other Ambulatory Visit: Payer: Self-pay

## 2017-05-20 VITALS — BP 130/72 | HR 108 | Temp 98.3°F | Resp 18 | Ht 61.0 in | Wt 205.4 lb

## 2017-05-20 DIAGNOSIS — S161XXA Strain of muscle, fascia and tendon at neck level, initial encounter: Secondary | ICD-10-CM

## 2017-05-20 DIAGNOSIS — M1710 Unilateral primary osteoarthritis, unspecified knee: Secondary | ICD-10-CM | POA: Insufficient documentation

## 2017-05-20 DIAGNOSIS — I10 Essential (primary) hypertension: Secondary | ICD-10-CM | POA: Insufficient documentation

## 2017-05-20 MED ORDER — MELOXICAM 7.5 MG PO TABS: 7.5000 mg | ORAL_TABLET | Freq: Every day | ORAL | 0 refills | Status: DC

## 2017-05-20 NOTE — Addendum Note (Signed)
Addended by: Morrell Riddle on: 05/20/2017 03:11 PM   Modules accepted: Level of Service

## 2017-05-20 NOTE — Progress Notes (Signed)
Melissa Arnold  MRN: [id number] DOB: 1934-02-11  PCP: Catha Gosselin, MD  Chief Complaint  Patient presents with  . Headache    since saturday pain behind left ear   . Depression    screening was a 8     Subjective:  Pt presents to clinic for left sided neck pain that started when she woke up 4 days ago.  She has pain that increase with neck movement - side to side and up and down.  No pain with chewing or burping, no ear pain, no hearing pain, no drainage out of ear.  No recent cold but she is having some allergy symptoms.  The pain also goes around the base of her skull to the right side.  She has use tylenol and BCs but she did not notice much help.  She thinks it might have gotten a little worse since it started.  No change in her finger paresthesias since this started.  History is obtained by patient.  Review of Systems  Constitutional: Negative for chills and fever.  Musculoskeletal: Positive for neck pain.  Neurological: Positive for headaches.    Patient Active Problem List   Diagnosis Date Noted  . Hypertension 05/20/2017  . Arthritis of knee 05/20/2017  . Vertigo 07/26/2016  . Acute allergic rhinitis 12/20/2015    Current Outpatient Medications on File Prior to Visit  Medication Sig Dispense Refill  . amLODipine (NORVASC) 5 MG tablet Take 5 mg by mouth daily.  2  . Aspirin-Salicylamide-Caffeine (BC HEADACHE POWDER PO) Take 1 each by mouth daily as needed (pain).    . cetirizine (ZYRTEC) 10 MG tablet Take 10 mg by mouth at bedtime as needed.  5  . Cholecalciferol (VITAMIN D3) 50000 units TABS Take by mouth.    . hydrochlorothiazide (HYDRODIURIL) 25 MG tablet Take 25 mg by mouth daily.    . montelukast (SINGULAIR) 10 MG tablet Take 10 mg by mouth every evening.  5  . quinapril (ACCUPRIL) 20 MG tablet Take 20 mg by mouth 2 (two) times daily.  1  . traMADol (ULTRAM) 50 MG tablet TAKE 1 TABLET BY MOUTH 3 TIMES A DAY AS NEEDED FOR PAIN  0   No current  facility-administered medications on file prior to visit.     Allergies  Allergen Reactions  . Gabapentin     WOOZY FEELING    Past Medical History:  Diagnosis Date  . Allergy   . Arthritis   . Cataract   . GERD (gastroesophageal reflux disease)   . Hypertension   . Osteoporosis   . Vitamin D deficiency    Social History   Social History Narrative  . Not on file   Social History   Tobacco Use  . Smoking status: Never Smoker  . Smokeless tobacco: Never Used  Substance Use Topics  . Alcohol use: No    Alcohol/week: 0.0 oz  . Drug use: No   family history includes Asthma in her mother; Heart disease in her brother; Heart failure in her father and mother; Hypertension in her father.     Objective:  BP 130/72   Pulse (!) 108   Temp 98.3 F (36.8 C) (Oral)   Resp 18   Ht 5\' 1"  (1.549 m)   Wt 205 lb 6.4 oz (93.2 kg)   SpO2 98%   BMI 38.81 kg/m  Body mass index is 38.81 kg/m.  Physical Exam  Constitutional: She is oriented to person, place, and time and well-developed, well-nourished,  and in no distress.  HENT:  Head: Normocephalic and atraumatic.    Right Ear: Hearing and external ear normal.  Left Ear: Hearing and external ear normal.  Eyes: Conjunctivae are normal.  Neck: Normal range of motion.  Cardiovascular: Normal rate, regular rhythm and normal heart sounds.  No murmur heard. Pulmonary/Chest: Effort normal and breath sounds normal. She has no wheezes.  Musculoskeletal:       Cervical back: She exhibits tenderness (sternoclediomastoid muscle) and spasm. She exhibits normal range of motion.  Neurological: She is alert and oriented to person, place, and time. Gait normal.  Skin: Skin is warm and dry.  Psychiatric: Mood, memory, affect and judgment normal.  Vitals reviewed.   Assessment and Plan :  Strain of neck muscle, initial encounter - Plan: meloxicam (MOBIC) 7.5 MG tablet  D/ pt her diagnosis.  Her ear is not causing her pain.  She will use  heat on her neck.  I suspect that she slept funny.  We will start with NSAID or pain and inflammation and not do muscle relaxers currently due to potential side effects.  Warning signs were given to the patient and when to RTC.  Benny Lennert PA-C  Primary Care at [facility] 05/20/2017 2:10 PM

## 2017-05-20 NOTE — Patient Instructions (Addendum)
  Heating pad to the area to help with the pain.  It is ok to use BC and tylenol but no Aleve or motrin while you are on the Mobic.     IF you received an x-ray today, you will receive an invoice from [facility]. Please contact [facility] at [phone] with questions or concerns regarding your invoice.   IF you received labwork today, you will receive an invoice from [facility]. Please contact [facility] at [phone] with questions or concerns regarding your invoice.   Our billing staff will not be able to assist you with questions regarding bills from these companies.  You will be contacted with the lab results as soon as they are available. The fastest way to get your results is to activate your My Chart account. Instructions are located on the last page of this paperwork. If you have not heard from [place] regarding the results in 2 weeks, please contact this office.

## 2017-06-30 ENCOUNTER — Encounter: Payer: Self-pay | Admitting: Physician Assistant

## 2017-06-30 ENCOUNTER — Ambulatory Visit: Payer: Medicare Other | Admitting: Physician Assistant

## 2017-06-30 ENCOUNTER — Other Ambulatory Visit: Payer: Self-pay

## 2017-06-30 ENCOUNTER — Telehealth: Payer: Self-pay | Admitting: Physician Assistant

## 2017-06-30 VITALS — BP 136/74 | HR 96 | Temp 98.2°F | Ht 62.5 in | Wt 209.0 lb

## 2017-06-30 DIAGNOSIS — T148XXA Other injury of unspecified body region, initial encounter: Secondary | ICD-10-CM | POA: Diagnosis not present

## 2017-06-30 DIAGNOSIS — R1031 Right lower quadrant pain: Secondary | ICD-10-CM

## 2017-06-30 LAB — POCT CBC
Granulocyte percent: 64.6 %G (ref 37–80)
HEMATOCRIT: 39.8 % (ref 37.7–47.9)
Hemoglobin: 12.3 g/dL (ref 12.2–16.2)
LYMPH, POC: 2 (ref 0.6–3.4)
MCH, POC: 24.7 pg — AB (ref 27–31.2)
MCHC: 30.9 g/dL — AB (ref 31.8–35.4)
MCV: 80.1 fL (ref 80–97)
MID (cbc): 0.4 (ref 0–0.9)
MPV: 6.3 fL (ref 0–99.8)
POC GRANULOCYTE: 4.5 (ref 2–6.9)
POC LYMPH %: 29.1 % (ref 10–50)
POC MID %: 6.3 % (ref 0–12)
Platelet Count, POC: 283 10*3/uL (ref 142–424)
RBC: 4.97 M/uL (ref 4.04–5.48)
RDW, POC: 15.3 %
WBC: 7 10*3/uL (ref 4.6–10.2)

## 2017-06-30 LAB — POCT URINALYSIS DIP (MANUAL ENTRY)
BILIRUBIN UA: NEGATIVE mg/dL
Bilirubin, UA: NEGATIVE
Glucose, UA: NEGATIVE mg/dL
Leukocytes, UA: NEGATIVE
Nitrite, UA: NEGATIVE
PROTEIN UA: NEGATIVE mg/dL
Spec Grav, UA: 1.01 (ref 1.010–1.025)
Urobilinogen, UA: 0.2 E.U./dL
pH, UA: 6.5 (ref 5.0–8.0)

## 2017-06-30 LAB — POC MICROSCOPIC URINALYSIS (UMFC): MUCUS RE: ABSENT

## 2017-06-30 NOTE — Patient Instructions (Addendum)
Your blood work and urine are reassuring. This is likely related to you lifting the heavy gallons of water. I recommend resting. Using heating pad to affected area and take tylenol as needed. If symptoms persist over the next week, please see your PCP as you may need a scan. If any of your symptoms worsen or you develop new concerning symptoms like fever, chills, nausea, vomiting, please seek care immediately. Thank you for letting me participate in your health and well being.       IF you received an x-ray today, you will receive an invoice from [facility]. Please contact [facility] at [phone] with questions or concerns regarding your invoice.   IF you received labwork today, you will receive an invoice from [facility]. Please contact [facility] at [phone] with questions or concerns regarding your invoice.   Our billing staff will not be able to assist you with questions regarding bills from these companies.  You will be contacted with the lab results as soon as they are available. The fastest way to get your results is to activate your My Chart account. Instructions are located on the last page of this paperwork. If you have not heard from [place] regarding the results in 2 weeks, please contact this office.

## 2017-06-30 NOTE — Telephone Encounter (Signed)
Please call patient and report that her urine microscopy showed no blood.  Continue with the recommendations we discussed in office.

## 2017-06-30 NOTE — Progress Notes (Signed)
Melissa Arnold  MRN: [id number] DOB: 1933/09/13  Subjective:     Melissa Arnold is a 84 y.o. female who presents for evaluation of abdominal pain. Onset was 1 week ago. Symptoms have been unchanged. The pain is described as discomfort, and is 2/10 in intensity. Pain is located in the RLQ without radiation.  Aggravating factors: activity and standing.  Alleviating factors: acetaminophen. Pt denies anorexia, arthralgias, belching, chills, constipation, diarrhea, dysuria, fever, flatus, frequency, headache, hematochezia, hematuria, melena, myalgias, nausea, sweats and vomiting. She is having normal BMs daily, last one was this morning.  No new food exposure and no recent travel.  Did carry 2 gallons of water in from her car last week and lifted heavy laundry basket last week and thinks this may have something to do with the pain. Denies smoking and alcohol use. She is not sexually active. Up to date on pap smear and colonoscopy. No PMH of ovarian cysts, or uterine abnormalities. Does have hx of diverticulosis in left colon, none on the right. Has never had diverticulitis flare. Has PSH of cholecystectomy. PCP is Dr.Little.   Review of Systems  Constitutional: Negative for activity change, appetite change, diaphoresis and unexpected weight change.  Genitourinary: Negative for vaginal bleeding, vaginal discharge and vaginal pain.    Patient Active Problem List   Diagnosis Date Noted  . Hypertension 05/20/2017  . Arthritis of knee 05/20/2017  . Vertigo 07/26/2016  . Acute allergic rhinitis 12/20/2015    Current Outpatient Medications on File Prior to Visit  Medication Sig Dispense Refill  . amLODipine (NORVASC) 5 MG tablet Take 5 mg by mouth daily.  2  . Aspirin-Salicylamide-Caffeine (BC HEADACHE POWDER PO) Take 1 each by mouth daily as needed (pain).    . cetirizine (ZYRTEC) 10 MG tablet Take 10 mg by mouth at bedtime as needed.  5  . Cholecalciferol (VITAMIN D3) 50000 units TABS Take by  mouth.    . hydrochlorothiazide (HYDRODIURIL) 25 MG tablet Take 25 mg by mouth daily.    . meloxicam (MOBIC) 7.5 MG tablet Take 1-2 tablets (7.5-15 mg total) by mouth daily. 30 tablet 0  . montelukast (SINGULAIR) 10 MG tablet Take 10 mg by mouth every evening.  5  . quinapril (ACCUPRIL) 20 MG tablet Take 20 mg by mouth 2 (two) times daily.  1  . traMADol (ULTRAM) 50 MG tablet TAKE 1 TABLET BY MOUTH 3 TIMES A DAY AS NEEDED FOR PAIN  0   No current facility-administered medications on file prior to visit.     Allergies  Allergen Reactions  . Gabapentin     WOOZY FEELING        Objective:  BP 136/74 (BP Location: Left Arm, Patient Position: Sitting, Cuff Size: Normal)   Pulse 96   Temp 98.2 F (36.8 C) (Oral)   Ht 5' 2.5" (1.588 m)   Wt 209 lb (94.8 kg)   SpO2 99%   BMI 37.62 kg/m   Physical Exam  Constitutional: She is oriented to person, place, and time. She appears well-developed and well-nourished. She does not appear ill.  HENT:  Head: Normocephalic and atraumatic.  Eyes: Conjunctivae are normal.  Neck: Normal range of motion.  Pulmonary/Chest: Effort normal.  Abdominal: Soft. Normal appearance and bowel sounds are normal. There is no hepatosplenomegaly. There is no tenderness. There is no rigidity, no rebound, no guarding, no CVA tenderness and no tenderness at McBurney's point. No hernia.  Negative psoas, obturator, and Rovsing's sign.  Genitourinary: Vagina normal  and uterus normal. Cervix exhibits no motion tenderness, no discharge and no friability. Right adnexum displays no mass, no tenderness and no fullness. Left adnexum displays no mass, no tenderness and no fullness.  Neurological: She is alert and oriented to person, place, and time.  Skin: Skin is warm and dry.  Psychiatric: She has a normal mood and affect.  Vitals reviewed.   Wt Readings from Last 3 Encounters:  06/30/17 209 lb (94.8 kg)  05/20/17 205 lb 6.4 oz (93.2 kg)  11/06/16 208 lb 6.4 oz (94.5  kg)   Results for orders placed or performed in visit on 06/30/17 (from the past 24 hour(s))  POCT CBC     Status: Abnormal   Collection Time: 06/30/17  2:25 PM  Result Value Ref Range   WBC 7.0 4.6 - 10.2 K/uL   Lymph, poc 2.0 0.6 - 3.4   POC LYMPH PERCENT 29.1 10 - 50 %L   MID (cbc) 0.4 0 - 0.9   POC MID % 6.3 0 - 12 %M   POC Granulocyte 4.5 2 - 6.9   Granulocyte percent 64.6 37 - 80 %G   RBC 4.97 4.04 - 5.48 M/uL   Hemoglobin 12.3 12.2 - 16.2 g/dL   HCT, POC 51.0 25.8 - 47.9 %   MCV 80.1 80 - 97 fL   MCH, POC 24.7 (A) 27 - 31.2 pg   MCHC 30.9 (A) 31.8 - 35.4 g/dL   RDW, POC 52.7 %   Platelet Count, POC 283 142 - 424 K/uL   MPV 6.3 0 - 99.8 fL  POCT urinalysis dipstick     Status: Abnormal   Collection Time: 06/30/17  2:29 PM  Result Value Ref Range   Color, UA yellow yellow   Clarity, UA clear clear   Glucose, UA negative negative mg/dL   Bilirubin, UA negative negative   Ketones, POC UA negative negative mg/dL   Spec Grav, UA 7.824 2.353 - 1.025   Blood, UA trace-intact (A) negative   pH, UA 6.5 5.0 - 8.0   Protein Ur, POC negative negative mg/dL   Urobilinogen, UA 0.2 0.2 or 1.0 E.U./dL   Nitrite, UA Negative Negative   Leukocytes, UA Negative Negative  POCT Microscopic Urinalysis ([facility])     Status: Abnormal   Collection Time: 06/30/17  4:03 PM  Result Value Ref Range   WBC,UR,HPF,POC None None WBC/hpf   RBC,UR,HPF,POC None None RBC/hpf   Bacteria Few (A) None, Too numerous to count   Mucus Absent Absent   Epithelial Cells, UR Per Microscopy Few (A) None, Too numerous to count cells/hpf     Assessment and Plan :  1. Right lower quadrant abdominal pain - POCT CBC - POCT urinalysis dipstick - POCT Microscopic Urinalysis ([facility]) 2. Muscle strain Physical exam findings and point-of-care labs reassuring. WBC wnl. UA and urine micro normal.  Patient is overall well-appearing, no distress.  No abdominal tenderness noted on exam. No pelvic mass or tenderness noted on  bimanual exam. History suspicious for muscular etiology due to recent heavy lifting for pt.   Recommend continue with Tylenol as needed and applying heating pad to affected area.  Avoid heavy lifting.  If no improvement in 1 week, return to office.  Consider imaging at that time if pain persists.  Encouraged to return sooner if any of her symptoms worsen or she develops new concerning symptoms.  Patient is in agreement with this plan.  Benjiman Core PA-C  Primary Care at [facility] 06/30/2017 4:08 PM

## 2017-07-01 ENCOUNTER — Encounter: Payer: Self-pay | Admitting: Physician Assistant

## 2017-07-01 LAB — URINALYSIS, MICROSCOPIC ONLY: Casts: NONE SEEN /lpf

## 2017-07-01 NOTE — Telephone Encounter (Signed)
Pt advised.

## 2017-07-08 ENCOUNTER — Ambulatory Visit: Payer: Medicare Other | Admitting: Adult Health

## 2017-07-08 ENCOUNTER — Encounter: Payer: Self-pay | Admitting: Adult Health

## 2017-07-08 VITALS — BP 151/90 | HR 73 | Ht 62.0 in | Wt 213.0 lb

## 2017-07-08 DIAGNOSIS — R42 Dizziness and giddiness: Secondary | ICD-10-CM | POA: Diagnosis not present

## 2017-07-08 DIAGNOSIS — G4489 Other headache syndrome: Secondary | ICD-10-CM

## 2017-07-08 NOTE — Patient Instructions (Signed)
Your Plan:  Schedule Carotid Dopplers If neck pain does not improve then physical therapy will be ordered If your symptoms worsen or you develop new symptoms please let us know.    Thank you for coming to see [place] at [facility]. I hope we have been able to provide you high quality care today.  You may receive a patient satisfaction survey over the next few weeks. We would appreciate your feedback and comments so that we may continue to improve ourselves and the health of our patients.

## 2017-07-08 NOTE — Progress Notes (Signed)
I have read the note, and I agree with the clinical assessment and plan.  Ethaniel Garfield K Lia Vigilante   

## 2017-07-08 NOTE — Progress Notes (Signed)
PATIENT: Melissa Arnold DOB: 01/05/1934  REASON FOR VISIT: follow up HISTORY FROM: patient  HISTORY OF PRESENT ILLNESS: Today 07/08/17 Melissa Arnold is an 84 year old female with a history of dizziness.  She returns today for follow-up.  She states that the dizziness can occur different times throughout the day.  She reports that sometimes she wakes up with dizziness.  Other times it may occur in the afternoon.  She also noticed dizziness with position changes.  She describes her dizziness as an off-balance sensation.  She denies the room spinning.  She has not been checking her blood pressure at home.  She states that the dizziness only lasts for a couple seconds and then subsides.  The patient never completed carotid Dopplers.  She reports that she is also been noticing headaches in the back of the head into the neck region.  She states that this is triggered when she turns the head to the left and keeps it in that  position.  She describes it as a dull ache.  She reports that she feels like the pain is muscular.  She denies any numbness or tingling down the arms.  Denies any weakness in the upper extremities.  She returns today for an evaluation.   HISTORY 11/06/16 Melissa Arnold is an 84 year old female with a history of vertigo. She returns today for follow-up. The patient states that she continues to have daily episodes of dizziness. This usually occurs when she is going from a sitting to a standing position. She reports that she will feel slightly lightheaded and then she returns to normal. She states that she never has any episodes of dizziness when she is sitting still. She does not notice any dizziness with head turning or rolling over in bed. She states that she has been checking her blood pressure at home but only when she is sitting. She reports that the systolic number ranges between 90 and 130. She has not been taking it once she stands. The patient did have an MRI of the brain that did not  show any significant white matter changes to explain her dizziness. Carotid Dopplers was ordered however the patient has not had these done yet. The patient denies any additional symptoms. She returns today for an evaluation.   REVIEW OF SYSTEMS: Out of a complete 14 system review of symptoms, the patient complains only of the following symptoms, and all other reviewed systems are negative.  See HPI  ALLERGIES: Allergies  Allergen Reactions  . Gabapentin     WOOZY FEELING    HOME MEDICATIONS: Outpatient Medications Prior to Visit  Medication Sig Dispense Refill  . amLODipine (NORVASC) 5 MG tablet Take 5 mg by mouth daily.  2  . Aspirin-Salicylamide-Caffeine (BC HEADACHE POWDER PO) Take 1 each by mouth daily as needed (pain).    . cetirizine (ZYRTEC) 10 MG tablet Take 10 mg by mouth at bedtime as needed.  5  . hydrochlorothiazide (HYDRODIURIL) 25 MG tablet Take 25 mg by mouth daily.    . meloxicam (MOBIC) 7.5 MG tablet Take 1-2 tablets (7.5-15 mg total) by mouth daily. 30 tablet 0  . montelukast (SINGULAIR) 10 MG tablet Take 10 mg by mouth every evening.  5  . quinapril (ACCUPRIL) 20 MG tablet Take 20 mg by mouth daily.   1  . traMADol (ULTRAM) 50 MG tablet TAKE 1 TABLET BY MOUTH 3 TIMES A DAY AS NEEDED FOR PAIN  0  . Cholecalciferol (VITAMIN D3) 50000 units TABS Take  by mouth.     No facility-administered medications prior to visit.     PAST MEDICAL HISTORY: Past Medical History:  Diagnosis Date  . Allergy   . Arthritis   . Cataract   . GERD (gastroesophageal reflux disease)   . Hypertension   . Osteoporosis   . Vitamin D deficiency     PAST SURGICAL HISTORY: Past Surgical History:  Procedure Laterality Date  . CHOLECYSTECTOMY    . EYE SURGERY      FAMILY HISTORY: Family History  Problem Relation Age of Onset  . Hypertension Father   . Heart failure Father   . Heart disease Brother   . Heart failure Mother   . Asthma Mother   . Heart attack Neg Hx   . Stroke  Neg Hx     SOCIAL HISTORY: Social History   Socioeconomic History  . Marital status: Widowed    Spouse name: Not on file  . Number of children: 6  . Years of education: Not on file  . Highest education level: Not on file  Occupational History  . Not on file  Social Needs  . Financial resource strain: Not on file  . Food insecurity:    Worry: Not on file    Inability: Not on file  . Transportation needs:    Medical: Not on file    Non-medical: Not on file  Tobacco Use  . Smoking status: Never Smoker  . Smokeless tobacco: Never Used  Substance and Sexual Activity  . Alcohol use: No    Alcohol/week: 0.0 oz  . Drug use: No  . Sexual activity: Not on file  Lifestyle  . Physical activity:    Days per week: Not on file    Minutes per session: Not on file  . Stress: Not on file  Relationships  . Social connections:    Talks on phone: Not on file    Gets together: Not on file    Attends religious service: Not on file    Active member of club or organization: Not on file    Attends meetings of clubs or organizations: Not on file    Relationship status: Not on file  . Intimate partner violence:    Fear of current or ex partner: Not on file    Emotionally abused: Not on file    Physically abused: Not on file    Forced sexual activity: Not on file  Other Topics Concern  . Not on file  Social History Narrative   Lives at home alone   [facility] with a cane   Right handed   Caffeine: 1 cup coffee daily and 1 cup of soda occasionally with dinner      PHYSICAL EXAM  Vitals:   07/08/17 0812 07/08/17 0815  BP: 140/87 (!) 151/90  Pulse: 70 73  Weight: 213 lb (96.6 kg)   Height: 5\' 2"  (1.575 m)    Body mass index is 38.96 kg/m.   ortho  Generalized: Well developed, in no acute distress   Neurological examination  Mentation: Alert oriented to time, place, history taking. Follows all commands speech and language fluent Cranial nerve II-XII: Pupils were equal round  reactive to light. Extraocular movements were full, visual field were full on confrontational test. Facial sensation and strength were normal. Uvula tongue midline. Head turning and shoulder shrug  were normal and symmetric. Motor: The motor testing reveals 5 over 5 strength of all 4 extremities. Good symmetric motor tone is noted throughout.  Sensory: Sensory testing is intact to soft touch on all 4 extremities. No evidence of extinction is noted.  Coordination: Cerebellar testing reveals good finger-nose-finger and heel-to-shin bilaterally.  Gait and station: Gait is normal. Tandem gait not attempted. Romberg is negative. No drift is seen.  Reflexes: Deep tendon reflexes are symmetric and normal bilaterally.   DIAGNOSTIC DATA (LABS, IMAGING, TESTING) - I reviewed patient records, labs, notes, testing and imaging myself where available.  Lab Results  Component Value Date   WBC 7.0 06/30/2017   HGB 12.3 06/30/2017   HCT 39.8 06/30/2017   MCV 80.1 06/30/2017   PLT 227 05/14/2016      Component Value Date/Time   NA 140 05/14/2016 2243   K 3.5 05/14/2016 2243   CL 105 05/14/2016 2243   CO2 27 05/14/2016 2243   GLUCOSE 110 (H) 05/14/2016 2243   BUN 13 05/14/2016 2243   CREATININE 0.81 05/14/2016 2243   CALCIUM 9.7 05/14/2016 2243   GFRNONAA >60 05/14/2016 2243   GFRAA >60 05/14/2016 2243      ASSESSMENT AND PLAN 84 y.o. year old female  has a past medical history of Allergy, Arthritis, Cataract, GERD (gastroesophageal reflux disease), Hypertension, Osteoporosis, and Vitamin D deficiency. here with:  1.  Dizziness 2.  Headache  The patient will get scheduled for carotid Dopplers to evaluate for potential cause of dizziness.  The patient is also having headaches in the occipital region radiating down to the neck only occurring when she turns the head to the left and leaves it in that position.  I suggested neuromuscular therapy and if she did not receive any benefit we then may  consider imaging.  The patient defers on physical therapy at this time.  States that if he gets worse she will let us know.  She will follow-up in 6 months or sooner if needed.    Butch Penny, MSN, NP-C 07/08/2017, 8:27 AM [facility] [address] [place], [place] [address] [phone]

## 2017-07-17 ENCOUNTER — Ambulatory Visit (HOSPITAL_COMMUNITY)
Admission: RE | Admit: 2017-07-17 | Discharge: 2017-07-17 | Disposition: A | Discharge status: Home / Self Care | Payer: Medicare Other | Source: Ambulatory Visit | Attending: Adult Health | Admitting: Adult Health

## 2017-07-17 DIAGNOSIS — R42 Dizziness and giddiness: Secondary | ICD-10-CM

## 2017-07-17 NOTE — Progress Notes (Signed)
Bilateral carotid duplex completed. Preliminary results - 1% to 39 % ICA stenosis. Vertebral artery flow is antegrade. [employer], RVS 07/17/2017 4:02 PM

## 2017-07-22 ENCOUNTER — Telehealth: Payer: Self-pay | Admitting: *Deleted

## 2017-07-22 NOTE — Telephone Encounter (Signed)
Spoke with patient and informed her that her carotid ultrasound showed no significant stenosis or narrowing. She verbalized understanding, appreciation, had no questions.

## 2017-11-03 ENCOUNTER — Other Ambulatory Visit: Payer: Self-pay | Admitting: Family Medicine

## 2017-11-03 DIAGNOSIS — R102 Pelvic and perineal pain: Secondary | ICD-10-CM

## 2017-11-12 ENCOUNTER — Ambulatory Visit
Admission: RE | Admit: 2017-11-12 | Discharge: 2017-11-12 | Disposition: A | Discharge status: Home / Self Care | Payer: Medicare Other | Source: Ambulatory Visit | Attending: Family Medicine | Admitting: Family Medicine

## 2017-11-12 DIAGNOSIS — R102 Pelvic and perineal pain: Secondary | ICD-10-CM

## 2018-01-07 ENCOUNTER — Encounter: Payer: Self-pay | Admitting: Adult Health

## 2018-01-07 ENCOUNTER — Ambulatory Visit: Payer: Medicare Other | Admitting: Adult Health

## 2018-01-07 VITALS — BP 126/87 | HR 78 | Ht 62.0 in | Wt 221.0 lb

## 2018-01-07 DIAGNOSIS — G44209 Tension-type headache, unspecified, not intractable: Secondary | ICD-10-CM

## 2018-01-07 DIAGNOSIS — R42 Dizziness and giddiness: Secondary | ICD-10-CM

## 2018-01-07 NOTE — Progress Notes (Signed)
I have read the note, and I agree with the clinical assessment and plan.  Izear Pine K Cammeron Greis   

## 2018-01-07 NOTE — Progress Notes (Signed)
PATIENT: Melissa Arnold DOB: [birth year]  REASON FOR VISIT: follow up HISTORY FROM: patient  HISTORY OF PRESENT ILLNESS: Today 01/07/18: Ms. Melissa Arnold is an 84 year old female with a history of dizziness and headache.  She returns today for follow-up.  She states that the dizziness is better.  It does not occur daily but she still has it.  It tends to occur later in the evening.  She has had a thorough work-up that has been relatively unremarkable.  She reports that she continues to have headaches that are typically in the back of the neck radiating to the back of the head.  Or it occurs on the right side.  She describes as if she is wearing a tight hat that feels like it squeezing her head.  She denies photophobia, phonophobia, nausea and vomiting.  She states that she takes a BC powder daily.  She takes Aleve or Tylenol daily as well.  Patient also notes that since she lost her husband last year she has become more reclusive perhaps slightly depressed.  Daughter agrees with this.  Daughter states that she tends to stay at home most of the time because she cannot drive.  She has 2 daughters here locally that check on her daily.  She returns today for evaluation.  HISTORY  07/08/17 Ms. Melissa Arnold is an 84 year old female with a history of dizziness.  She returns today for follow-up.  She states that the dizziness can occur different times throughout the day.  She reports that sometimes she wakes up with dizziness.  Other times it may occur in the afternoon.  She also noticed dizziness with position changes.  She describes her dizziness as an off-balance sensation.  She denies the room spinning.  She has not been checking her blood pressure at home.  She states that the dizziness only lasts for a couple seconds and then subsides.  The patient never completed carotid Dopplers.  She reports that she is also been noticing headaches in the back of the head into the neck region.  She states that this is triggered  when she turns the head to the left and keeps it in that  position.  She describes it as a dull ache.  She reports that she feels like the pain is muscular.  She denies any numbness or tingling down the arms.  Denies any weakness in the upper extremities.  She returns today for an evaluation.   REVIEW OF SYSTEMS: Out of a complete 14 system review of symptoms, the patient complains only of the following symptoms, and all other reviewed systems are negative.  Runny nose, shortness of breath, dizziness, headache, numbness, weakness, joint pain, back pain, depression, nervous/anxious, environmental allergies  ALLERGIES: Allergies  Allergen Reactions  . Gabapentin     WOOZY FEELING    HOME MEDICATIONS: Outpatient Medications Prior to Visit  Medication Sig Dispense Refill  . amLODipine (NORVASC) 5 MG tablet Take 5 mg by mouth daily.  2  . Aspirin-Salicylamide-Caffeine (BC HEADACHE POWDER PO) Take 1 each by mouth daily as needed (pain).    . cetirizine (ZYRTEC) 10 MG tablet Take 10 mg by mouth at bedtime as needed.  5  . Cholecalciferol (VITAMIN D3) 50000 units TABS Take by mouth.    . hydrochlorothiazide (HYDRODIURIL) 25 MG tablet Take 25 mg by mouth daily.    . montelukast (SINGULAIR) 10 MG tablet Take 10 mg by mouth every evening.  5  . quinapril (ACCUPRIL) 20 MG tablet Take 20 mg  by mouth daily.   1  . traMADol (ULTRAM) 50 MG tablet TAKE 1 TABLET BY MOUTH 3 TIMES A DAY AS NEEDED FOR PAIN  0  . meloxicam (MOBIC) 7.5 MG tablet Take 1-2 tablets (7.5-15 mg total) by mouth daily. (Patient not taking: Reported on 01/07/2018) 30 tablet 0   No facility-administered medications prior to visit.     PAST MEDICAL HISTORY: Past Medical History:  Diagnosis Date  . Allergy   . Arthritis   . Cataract   . GERD (gastroesophageal reflux disease)   . Hypertension   . Osteoporosis   . Vitamin D deficiency     PAST SURGICAL HISTORY: Past Surgical History:  Procedure Laterality Date  .  CHOLECYSTECTOMY    . EYE SURGERY      FAMILY HISTORY: Family History  Problem Relation Age of Onset  . Hypertension Father   . Heart failure Father   . Heart disease Brother   . Heart failure Mother   . Asthma Mother   . Heart attack Neg Hx   . Stroke Neg Hx     SOCIAL HISTORY: Social History   Socioeconomic History  . Marital status: Widowed    Spouse name: Not on file  . Number of children: 6  . Years of education: Not on file  . Highest education level: Not on file  Occupational History  . Not on file  Social Needs  . Financial resource strain: Not on file  . Food insecurity:    Worry: Not on file    Inability: Not on file  . Transportation needs:    Medical: Not on file    Non-medical: Not on file  Tobacco Use  . Smoking status: Never Smoker  . Smokeless tobacco: Never Used  Substance and Sexual Activity  . Alcohol use: No    Alcohol/week: 0.0 standard drinks  . Drug use: No  . Sexual activity: Not on file  Lifestyle  . Physical activity:    Days per week: Not on file    Minutes per session: Not on file  . Stress: Not on file  Relationships  . Social connections:    Talks on phone: Not on file    Gets together: Not on file    Attends religious service: Not on file    Active member of club or organization: Not on file    Attends meetings of clubs or organizations: Not on file    Relationship status: Not on file  . Intimate partner violence:    Fear of current or ex partner: Not on file    Emotionally abused: Not on file    Physically abused: Not on file    Forced sexual activity: Not on file  Other Topics Concern  . Not on file  Social History Narrative   Lives at home alone   [facility] with a cane   Right handed   Caffeine: 1 cup coffee daily and 1 cup of soda occasionally with dinner      PHYSICAL EXAM  Vitals:   01/07/18 0825  BP: 126/87  Pulse: 78  Weight: 221 lb (100.2 kg)  Height: 5\' 2"  (1.575 m)   Body mass index is 40.42  kg/m.  Generalized: Well developed, in no acute distress   Neurological examination  Mentation: Alert oriented to time, place, history taking. Follows all commands speech and language fluent Cranial nerve II-XII: Pupils were equal round reactive to light. Extraocular movements were full, visual field were full on confrontational test.  Facial sensation and strength were normal. Uvula tongue midline. Head turning and shoulder shrug  were normal and symmetric. Motor: The motor testing reveals 5 over 5 strength of all 4 extremities. Good symmetric motor tone is noted throughout.  Sensory: Sensory testing is intact to soft touch on all 4 extremities. No evidence of extinction is noted.  Coordination: Cerebellar testing reveals good finger-nose-finger and heel-to-shin bilaterally.  Gait and station: Patient uses a cane when ambulating. Reflexes: Deep tendon reflexes are symmetric and normal bilaterally.   DIAGNOSTIC DATA (LABS, IMAGING, TESTING) - I reviewed patient records, labs, notes, testing and imaging myself where available.  Lab Results  Component Value Date   WBC 7.0 06/30/2017   HGB 12.3 06/30/2017   HCT 39.8 06/30/2017   MCV 80.1 06/30/2017   PLT 227 05/14/2016      Component Value Date/Time   NA 140 05/14/2016 2243   K 3.5 05/14/2016 2243   CL 105 05/14/2016 2243   CO2 27 05/14/2016 2243   GLUCOSE 110 (H) 05/14/2016 2243   BUN 13 05/14/2016 2243   CREATININE 0.81 05/14/2016 2243   CALCIUM 9.7 05/14/2016 2243   GFRNONAA >60 05/14/2016 2243   GFRAA >60 05/14/2016 2243     ASSESSMENT AND PLAN 84 y.o. year old female  has a past medical history of Allergy, Arthritis, Cataract, GERD (gastroesophageal reflux disease), Hypertension, Osteoporosis, and Vitamin D deficiency. here with:  1.  Dizziness 2.  Possible tension type headaches  The patient's dizziness has improved however  her work-up has been relatively unremarkable.  We will continue to monitor.  She does report  tension type headaches.  She is taking BC powder, Tylenol and Aleve daily.  She perhaps could be having rebound headaches.  I advised that she should slowly decrease her use of BC powder.  She should stop using Tylenol and Aleve daily.  Patient voiced understanding.  If her headaches do not improve we potentially could try a low-dose muscle relaxer at bedtime.  In regards to possible depression.  I encouraged the patient to get involved in social activities for senior citizens.  This may improve her mood as well.  In the future we may consider an antidepressant if social activities does not improve her mood.  Patient voiced understanding.  She will follow-up in 6 months or sooner as needed.   Butch Penny, MSN, NP-C 01/07/2018, 8:36 AM [facility] [address], [address] [place], [place] [address] [phone]

## 2018-01-07 NOTE — Patient Instructions (Signed)
Your Plan:  Wean off of BC powders Stop using aleve and tylenol daily  If headaches do not improve call and we will consider a muscle relaxer If your symptoms worsen or you develop new symptoms please let us know.   Thank you for coming to see [place] at [facility]. I hope we have been able to provide you high quality care today.  You may receive a patient satisfaction survey over the next few weeks. We would appreciate your feedback and comments so that we may continue to improve ourselves and the health of our patients.

## 2018-03-17 ENCOUNTER — Emergency Department (HOSPITAL_COMMUNITY): Payer: Medicare Other

## 2018-03-17 ENCOUNTER — Observation Stay (HOSPITAL_COMMUNITY)
Admission: EM | Admit: 2018-03-17 | Discharge: 2018-03-18 | Disposition: A | Discharge status: Home / Self Care | Payer: Medicare Other | Attending: Internal Medicine | Admitting: Internal Medicine

## 2018-03-17 ENCOUNTER — Encounter (HOSPITAL_COMMUNITY): Payer: Self-pay

## 2018-03-17 DIAGNOSIS — I10 Essential (primary) hypertension: Secondary | ICD-10-CM

## 2018-03-17 DIAGNOSIS — Z791 Long term (current) use of non-steroidal anti-inflammatories (NSAID): Secondary | ICD-10-CM | POA: Insufficient documentation

## 2018-03-17 DIAGNOSIS — Z6841 Body Mass Index (BMI) 40.0 and over, adult: Secondary | ICD-10-CM | POA: Diagnosis not present

## 2018-03-17 DIAGNOSIS — Z79891 Long term (current) use of opiate analgesic: Secondary | ICD-10-CM | POA: Insufficient documentation

## 2018-03-17 DIAGNOSIS — R0602 Shortness of breath: Secondary | ICD-10-CM | POA: Diagnosis present

## 2018-03-17 DIAGNOSIS — Z79899 Other long term (current) drug therapy: Secondary | ICD-10-CM | POA: Insufficient documentation

## 2018-03-17 DIAGNOSIS — R079 Chest pain, unspecified: Principal | ICD-10-CM | POA: Diagnosis present

## 2018-03-17 DIAGNOSIS — R42 Dizziness and giddiness: Secondary | ICD-10-CM | POA: Diagnosis present

## 2018-03-17 DIAGNOSIS — Z7982 Long term (current) use of aspirin: Secondary | ICD-10-CM | POA: Diagnosis not present

## 2018-03-17 LAB — CBC
HEMATOCRIT: 39.1 % (ref 36.0–46.0)
Hemoglobin: 11.8 g/dL — ABNORMAL LOW (ref 12.0–15.0)
MCH: 25.5 pg — AB (ref 26.0–34.0)
MCHC: 30.2 g/dL (ref 30.0–36.0)
MCV: 84.4 fL (ref 80.0–100.0)
NRBC: 0 % (ref 0.0–0.2)
Platelets: 235 10*3/uL (ref 150–400)
RBC: 4.63 MIL/uL (ref 3.87–5.11)
RDW: 15.4 % (ref 11.5–15.5)
WBC: 7 10*3/uL (ref 4.0–10.5)

## 2018-03-17 LAB — I-STAT TROPONIN, ED: Troponin i, poc: 0 ng/mL (ref 0.00–0.08)

## 2018-03-17 LAB — BASIC METABOLIC PANEL
Anion gap: 9 (ref 5–15)
BUN: 13 mg/dL (ref 8–23)
CHLORIDE: 105 mmol/L (ref 98–111)
CO2: 26 mmol/L (ref 22–32)
CREATININE: 0.92 mg/dL (ref 0.44–1.00)
Calcium: 9.1 mg/dL (ref 8.9–10.3)
GFR calc Af Amer: >60 mL/min (ref >60)
GFR calc non Af Amer: 58 mL/min — ABNORMAL LOW (ref >60)
GLUCOSE: 176 mg/dL — AB (ref 70–99)
Potassium: 3.7 mmol/L (ref 3.5–5.1)
Sodium: 140 mmol/L (ref 135–145)

## 2018-03-17 LAB — HEPATIC FUNCTION PANEL
ALT: 17 U/L (ref 0–44)
AST: 25 U/L (ref 15–41)
Albumin: 3.7 g/dL (ref 3.5–5.0)
Alkaline Phosphatase: 63 U/L (ref 38–126)
Bilirubin, Direct: 0.1 mg/dL (ref 0.0–0.2)
Indirect Bilirubin: 0.6 mg/dL (ref 0.3–0.9)
Total Bilirubin: 0.7 mg/dL (ref 0.3–1.2)
Total Protein: 6.8 g/dL (ref 6.5–8.1)

## 2018-03-17 LAB — LIPASE, BLOOD: Lipase: 40 U/L (ref 11–51)

## 2018-03-17 LAB — TROPONIN I: Troponin I: <0.03 ng/mL (ref <0.03)

## 2018-03-17 LAB — HEMOGLOBIN A1C
Hgb A1c MFr Bld: 5.8 % — ABNORMAL HIGH (ref 4.8–5.6)
Mean Plasma Glucose: 119.76 mg/dL

## 2018-03-17 LAB — D-DIMER, QUANTITATIVE (NOT AT ARMC): D DIMER QUANT: 2.27 ug{FEU}/mL — AB (ref 0.00–0.50)

## 2018-03-17 LAB — TSH: TSH: 0.931 u[IU]/mL (ref 0.350–4.500)

## 2018-03-17 MED ORDER — IOPAMIDOL (ISOVUE-370) INJECTION 76%
100.0000 mL | Freq: Once | INTRAVENOUS | Status: AC | PRN
  Administered 2018-03-17: 100 mL via INTRAVENOUS

## 2018-03-17 MED ORDER — SODIUM CHLORIDE (PF) 0.9 % IJ SOLN
INTRAMUSCULAR | Status: AC
  Filled 2018-03-17: qty 50

## 2018-03-17 MED ORDER — AMLODIPINE BESYLATE 5 MG PO TABS
5.0000 mg | ORAL_TABLET | Freq: Every day | ORAL | Status: DC
  Administered 2018-03-17 – 2018-03-18 (×2): 5 mg via ORAL
  Filled 2018-03-17 (×2): qty 1

## 2018-03-17 MED ORDER — ADULT MULTIVITAMIN W/MINERALS CH
1.0000 | ORAL_TABLET | Freq: Every day | ORAL | Status: DC
  Administered 2018-03-18: 1 via ORAL
  Filled 2018-03-17: qty 1

## 2018-03-17 MED ORDER — FLUTICASONE PROPIONATE 50 MCG/ACT NA SUSP
1.0000 | Freq: Every day | NASAL | Status: DC
  Administered 2018-03-17 – 2018-03-18 (×2): 1 via NASAL
  Filled 2018-03-17: qty 16

## 2018-03-17 MED ORDER — ENOXAPARIN SODIUM 40 MG/0.4ML ~~LOC~~ SOLN
40.0000 mg | SUBCUTANEOUS | Status: DC
  Administered 2018-03-17: 40 mg via SUBCUTANEOUS
  Filled 2018-03-17: qty 0.4

## 2018-03-17 MED ORDER — ASPIRIN 81 MG PO CHEW
325.0000 mg | CHEWABLE_TABLET | Freq: Once | ORAL | Status: AC
  Administered 2018-03-17: 325 mg via ORAL
  Filled 2018-03-17: qty 5

## 2018-03-17 MED ORDER — ACETAMINOPHEN 500 MG PO TABS: 1000.0000 mg | ORAL_TABLET | Freq: Four times a day (QID) | ORAL | Status: DC | PRN

## 2018-03-17 MED ORDER — LISINOPRIL 20 MG PO TABS
20.0000 mg | ORAL_TABLET | Freq: Every day | ORAL | Status: DC
  Administered 2018-03-17 – 2018-03-18 (×2): 20 mg via ORAL
  Filled 2018-03-17 (×2): qty 1

## 2018-03-17 MED ORDER — TRAMADOL HCL 50 MG PO TABS
50.0000 mg | ORAL_TABLET | Freq: Three times a day (TID) | ORAL | Status: DC | PRN
  Administered 2018-03-18: 50 mg via ORAL
  Filled 2018-03-17: qty 1

## 2018-03-17 MED ORDER — IOPAMIDOL (ISOVUE-370) INJECTION 76%
INTRAVENOUS | Status: AC
  Filled 2018-03-17: qty 100

## 2018-03-17 MED ORDER — QUINAPRIL HCL 10 MG PO TABS
20.0000 mg | ORAL_TABLET | Freq: Every day | ORAL | Status: DC
  Filled 2018-03-17: qty 2

## 2018-03-17 MED ORDER — HYDRALAZINE HCL 20 MG/ML IJ SOLN: 5.0000 mg | Freq: Four times a day (QID) | INTRAMUSCULAR | Status: DC | PRN

## 2018-03-17 MED ORDER — MONTELUKAST SODIUM 10 MG PO TABS
10.0000 mg | ORAL_TABLET | Freq: Every evening | ORAL | Status: DC
  Administered 2018-03-17: 10 mg via ORAL
  Filled 2018-03-17: qty 1

## 2018-03-17 NOTE — ED Notes (Signed)
Patient states at times she has abdominal pain on the Upper RT Quadrant that feels aching. Patient states pain comes and goes, she experiences this pain getting in and out of her daughters vehicle. Patient doesn't believe pain is associated with eating patterns.

## 2018-03-17 NOTE — ED Notes (Signed)
Patient transported to X-ray 

## 2018-03-17 NOTE — ED Notes (Signed)
ED TO INPATIENT HANDOFF REPORT  Name/Age/Gender Melissa Arnold 84 y.o. female  Code Status   Home/SNF/Other Home  Chief Complaint Chest Pain  Level of Care/Admitting Diagnosis ED Disposition    ED Disposition Condition Comment   Admit  Hospital Area: [facility] [[id number]]  Level of Care: Telemetry [5]  Admit to tele based on following criteria: Monitor for Ischemic changes  Diagnosis: Chest pain [902409]  Admitting Physician: Albertine Grates [[id number]]  Attending Physician: Albertine Grates [[id number]]  PT Class (Do Not Modify): Observation [104]  PT Acc Code (Do Not Modify): Observation [10022]       Medical History Past Medical History:  Diagnosis Date  . Allergy   . Arthritis   . Cataract   . GERD (gastroesophageal reflux disease)   . Hypertension   . Osteoporosis   . Vitamin D deficiency     Allergies Allergies  Allergen Reactions  . Gabapentin Other (See Comments)    Woozy feeling    IV Location/Drains/Wounds Patient Lines/Drains/Airways Status   Active Line/Drains/Airways    Name:   Placement date:   Placement time:   Site:   Days:   Peripheral IV 03/17/18 Left Hand   03/17/18    0958    Hand   less than 1   Peripheral IV 03/17/18 Left Arm   03/17/18    1435    Arm   less than 1          Labs/Imaging Results for orders placed or performed during the hospital encounter of 03/17/18 (from the past 48 hour(s))  Basic metabolic panel     Status: Abnormal   Collection Time: 03/17/18  9:36 AM  Result Value Ref Range   Sodium 140 135 - 145 mmol/L   Potassium 3.7 3.5 - 5.1 mmol/L   Chloride 105 98 - 111 mmol/L   CO2 26 22 - 32 mmol/L   Glucose, Bld 176 (H) 70 - 99 mg/dL   BUN 13 8 - 23 mg/dL   Creatinine, Ser 1.96 0.44 - 1.00 mg/dL   Calcium 9.1 8.9 - 22.2 mg/dL   GFR calc non Af Amer 58 (L) >60 mL/min   GFR calc Af Amer >60 >60 mL/min   Anion gap 9 5 - 15    Comment: Performed at [facility], [address],  [place], [place] [address]  CBC     Status: Abnormal   Collection Time: 03/17/18  9:36 AM  Result Value Ref Range   WBC 7.0 4.0 - 10.5 K/uL   RBC 4.63 3.87 - 5.11 MIL/uL   Hemoglobin 11.8 (L) 12.0 - 15.0 g/dL   HCT 21.1 94.1 - 74.0 %   MCV 84.4 80.0 - 100.0 fL   MCH 25.5 (L) 26.0 - 34.0 pg   MCHC 30.2 30.0 - 36.0 g/dL   RDW 81.4 48.1 - 85.6 %   Platelets 235 150 - 400 K/uL   nRBC 0.0 0.0 - 0.2 %    Comment: Performed at [facility], [address], [place], [place] [address]  Hepatic function panel     Status: None   Collection Time: 03/17/18  9:36 AM  Result Value Ref Range   Total Protein 6.8 6.5 - 8.1 g/dL   Albumin 3.7 3.5 - 5.0 g/dL   AST 25 15 - 41 U/L   ALT 17 0 - 44 U/L   Alkaline Phosphatase 63 38 - 126 U/L   Total Bilirubin 0.7 0.3 - 1.2  mg/dL   Bilirubin, Direct 0.1 0.0 - 0.2 mg/dL   Indirect Bilirubin 0.6 0.3 - 0.9 mg/dL    Comment: Performed at [facility], [address] [place], [place] [address]  Lipase, blood     Status: None   Collection Time: 03/17/18  9:36 AM  Result Value Ref Range   Lipase 40 11 - 51 U/L    Comment: Performed at [facility], [address], [place], [place] [address]  D-dimer, quantitative (not at [facility])     Status: Abnormal   Collection Time: 03/17/18  9:36 AM  Result Value Ref Range   D-Dimer, Quant 2.27 (H) 0.00 - 0.50 ug/mL-FEU    Comment: (NOTE) At the manufacturer cut-off of 0.50 ug/mL FEU, this assay has been documented to exclude PE with a sensitivity and negative predictive value of 97 to 99%.  At this time, this assay has not been approved by the FDA to exclude DVT/VTE. Results should be correlated with clinical presentation. Performed at [facility], [address], [place], [place] [address]   I-stat troponin, ED     Status: None   Collection Time: 03/17/18 10:02 AM  Result Value Ref Range   Troponin i, poc 0.00 0.00 - 0.08 ng/mL   Comment 3             Comment: Due to the release kinetics of cTnI, a negative result within the first hours of the onset of symptoms does not rule out myocardial infarction with certainty. If myocardial infarction is still suspected, repeat the test at appropriate intervals.    Dg Chest 2 View  Result Date: 03/17/2018 CLINICAL DATA:  Chest pain and shortness of breath EXAM: CHEST - 2 VIEW COMPARISON:  May 14, 2016 FINDINGS: There is no edema or consolidation. The heart size and pulmonary vascularity are within normal limits. There is aortic atherosclerosis. No adenopathy. There is mild degenerative change in thoracic spine. IMPRESSION: No edema or consolidation. Stable cardiac silhouette. There is aortic atherosclerosis. Aortic Atherosclerosis (ICD10-I70.0). Electronically Signed   By: Bretta Bang III M.D.   On: 03/17/2018 10:36   Ct Head Wo Contrast  Result Date: 03/17/2018 CLINICAL DATA:  Headache with vertigo EXAM: CT HEAD WITHOUT CONTRAST TECHNIQUE: Contiguous axial images were obtained from the base of the skull through the vertex without intravenous contrast. COMPARISON:  Head CT July 02, 2017 and brain MRI Aug 11, 2016 FINDINGS: Brain: There is age related volume loss. 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 There is no other evident mass. There is no hemorrhage, extra-axial fluid collection, or midline shift. There is slight small vessel disease in the centra semiovale bilaterally. No acute infarct evident. Vascular: No hyperdense vessel. There is calcification in each carotid siphon region. Skull: The bony calvarium appears intact. Sinuses/Orbits: There is mucosal thickening in several ethmoid air cells. Other visualized paranasal sinuses are clear. Visualized orbits appear symmetric bilaterally. Other: Visualized mastoid air cells are clear. IMPRESSION: Age related volume loss with mild periventricular small vessel disease. No acute infarct.  Subcentimeter anterior falcine lipoma, a benign finding which is stable. No evident hemorrhage. There are foci of arterial vascular calcification. There is mucosal thickening in several ethmoid air cells. Electronically Signed   By: Bretta Bang III M.D.   On: 03/17/2018 15:13   Ct Angio Chest Pe W And/or Wo Contrast  Result Date: 03/17/2018 CLINICAL DATA:  Chest pain, shortness of breath EXAM:  CT ANGIOGRAPHY CHEST WITH CONTRAST TECHNIQUE: Multidetector CT imaging of the chest was performed using the standard protocol during bolus administration of intravenous contrast. Multiplanar CT image reconstructions and MIPs were obtained to evaluate the vascular anatomy. CONTRAST:  ISOVUE-370 IOPAMIDOL (ISOVUE-370) INJECTION 76% COMPARISON:  None. FINDINGS: Cardiovascular: Satisfactory opacification of the pulmonary arteries to the segmental level. No evidence of pulmonary embolism. Normal heart size. No pericardial effusion. Thoracic aorta is normal in caliber. No thoracic aortic dissection. Mediastinum/Nodes: No enlarged mediastinal, hilar, or axillary lymph nodes. Thyroid gland, trachea, and esophagus demonstrate no significant findings. Lungs/Pleura: Lungs are clear. No pleural effusion or pneumothorax. Upper Abdomen: No acute abnormality. Musculoskeletal: No acute osseous abnormality. No aggressive osseous lesion. Mild degenerative disc disease throughout the thoracic spine. Review of the MIP images confirms the above findings. IMPRESSION: 1. No evidence of pulmonary embolus. 2. No acute cardiopulmonary disease. Electronically Signed   By: Elige Ko   On: 03/17/2018 15:16    Pending Labs Unresulted Labs (From admission, onward)    Start     Ordered   03/18/18 0500  Lipid panel  Tomorrow morning,   R     03/17/18 1701   03/18/18 0500  CBC  Tomorrow morning,   R     03/17/18 1702   03/18/18 0500  Basic metabolic panel  Tomorrow morning,   R     03/17/18 1702   03/18/18 0500  Magnesium   Tomorrow morning,   R     03/17/18 1702   03/17/18 1702  TSH  Once,   R     03/17/18 1702   03/17/18 1701  Hemoglobin A1c  Once,   R     03/17/18 1701   03/17/18 1701  Troponin I - Now Then Q6H  Now then every 6 hours,   R     03/17/18 1701          Vitals/Pain Today's Vitals   03/17/18 1015 03/17/18 1230 03/17/18 1454 03/17/18 1756  BP:  (!) 177/119 (!) 164/118 (!) 193/114  Pulse:  86 92 93  Resp:  11 18 14   Temp:      TempSrc:      SpO2:  98% 100% 96%  PainSc: 6        Isolation Precautions No active isolations  Medications Medications  sodium chloride (PF) 0.9 % injection (has no administration in time range)  iopamidol (ISOVUE-370) 76 % injection (has no administration in time range)  acetaminophen (TYLENOL) tablet 1,000 mg (has no administration in time range)  amLODipine (NORVASC) tablet 5 mg (has no administration in time range)  montelukast (SINGULAIR) tablet 10 mg (has no administration in time range)  MULTIVITAMIN WOMEN 50+ TABS 1 tablet (has no administration in time range)  quinapril (ACCUPRIL) tablet 20 mg (has no administration in time range)  traMADol (ULTRAM) tablet 50 mg (has no administration in time range)  aspirin chewable tablet 325 mg (325 mg Oral Given 03/17/18 1131)  iopamidol (ISOVUE-370) 76 % injection 100 mL (100 mLs Intravenous Contrast Given 03/17/18 1449)    Mobility walks with device

## 2018-03-17 NOTE — ED Notes (Signed)
Patient transported to CT 

## 2018-03-17 NOTE — ED Provider Notes (Signed)
[facility]-EMERGENC[facility] DEPT Provider Note   CSN: [id number] Arrival date & time: 03/17/18  7591     History   Chief Complaint Chief Complaint  Patient presents with  . Chest Pain    HPI Melissa Arnold is a 84 y.o. female.  HPI For one week has had chest pain off and on Comes on more when first wake up in the morning, around 3PM will start to feel fatigued and short of breath Feels like a burning pain in the middle of the chest and sharp pain bilaterally ribs.  Chest pain episode began this morning, lasted 67min-1hr. Not necessarily worse with exertion.  Shortness of  Breath is continuing.  No orthopnea, no dyspnea on exertion.  Bilateral arm pain with diaphoresis yesterday and shortness of breath.  Also reports dizziness off and on, yesterday AM had sharp pain for 20 minutes in head then woke up dizzy On and off dizziness over the last 6 mos, 3 times per week No numbness/weakness on one side or other, no difficulty talking, walks with cane, no facial droop, has had pain behind ear that comes and goes  Dizziness when first woke up today which is resolved.  No cough or fever.   Yesterday had bilateral arm pain, diaphoresis and dyspnea  htn No DM, no chol, no smoking, no fam hx  Past Medical History:  Diagnosis Date  . Allergy   . Arthritis   . Cataract   . GERD (gastroesophageal reflux disease)   . Hypertension   . Osteoporosis   . Vitamin D deficiency     Patient Active Problem List   Diagnosis Date Noted  . Chest pain 03/17/2018  . Hypertension 05/20/2017  . Arthritis of knee 05/20/2017  . Vertigo 07/26/2016  . Acute allergic rhinitis 12/20/2015    Past Surgical History:  Procedure Laterality Date  . CHOLECYSTECTOMY    . EYE SURGERY       OB History   No obstetric history on file.      Home Medications    Prior to Admission medications   Medication Sig Start Date End Date Taking? Authorizing Provider  acetaminophen  (TYLENOL) 500 MG tablet Take 1,000 mg by mouth every 6 (six) hours as needed for mild pain.   Yes [provider]  amLODipine (NORVASC) 5 MG tablet Take 5 mg by mouth daily. 04/28/17  Yes [provider]  Aspirin-Salicylamide-Caffeine (BC HEADACHE POWDER PO) Take 1 each by mouth daily as needed (pain).   Yes [provider]  guaifenesin (ROBITUSSIN) 100 MG/5ML syrup Take 100 mg by mouth 3 (three) times daily as needed for cough.   Yes [provider]  hydrochlorothiazide (HYDRODIURIL) 25 MG tablet Take 25 mg by mouth daily.   Yes [provider]  montelukast (SINGULAIR) 10 MG tablet Take 10 mg by mouth every evening. 06/27/16  Yes [provider]  Multiple Vitamins-Minerals (MULTIVITAMIN WOMEN 50+) TABS Take 1 tablet by mouth daily.   Yes [provider]  quinapril (ACCUPRIL) 20 MG tablet Take 20 mg by mouth daily.  03/16/15  Yes [provider]  sodium-potassium bicarbonate (ALKA-SELTZER GOLD) TBEF dissolvable tablet Take 1 tablet by mouth daily as needed (heartburn).   Yes [provider]  traMADol (ULTRAM) 50 MG tablet Take 50 mg by mouth 3 (three) times daily as needed for moderate pain.  06/27/16  Yes [provider]  meloxicam (MOBIC) 7.5 MG tablet Take 1-2 tablets (7.5-15 mg total) by mouth daily. Patient  not taking: Reported on 01/07/2018 05/20/17   Morrell Riddle, PA-C    Family History Family History  Problem Relation Age of Onset  . Hypertension Father   . Heart failure Father   . Heart disease Brother   . Heart failure Mother   . Asthma Mother   . Heart attack Neg Hx   . Stroke Neg Hx     Social History Social History   Tobacco Use  . Smoking status: Never Smoker  . Smokeless tobacco: Never Used  Substance Use Topics  . Alcohol use: No    Alcohol/week: 0.0 standard drinks  . Drug use: No     Allergies   Gabapentin   Review of Systems Review of Systems  Constitutional: Positive  for diaphoresis and fatigue. Negative for fever.  HENT: Positive for postnasal drip.   Respiratory: Positive for shortness of breath. Negative for cough.   Cardiovascular: Positive for chest pain.  Gastrointestinal: Negative for abdominal pain, nausea and vomiting.  Genitourinary: Negative for dysuria.  Neurological: Positive for dizziness, light-headedness and headaches. Negative for syncope, facial asymmetry, speech difficulty, weakness and numbness.     Physical Exam Updated Vital Signs BP (!) 193/114   Pulse 93   Temp 98.5 F (36.9 C) (Oral)   Resp 14   SpO2 96%   Physical Exam Vitals signs and nursing note reviewed.  Constitutional:      General: She is not in acute distress.    Appearance: She is well-developed. She is not diaphoretic.  HENT:     Head: Normocephalic and atraumatic.  Eyes:     Conjunctiva/sclera: Conjunctivae normal.  Neck:     Musculoskeletal: Normal range of motion.  Cardiovascular:     Rate and Rhythm: Normal rate and regular rhythm.     Heart sounds: Normal heart sounds. No murmur. No friction rub. No gallop.   Pulmonary:     Effort: Pulmonary effort is normal. No respiratory distress.     Breath sounds: Normal breath sounds. No wheezing or rales.  Abdominal:     General: There is no distension.     Palpations: Abdomen is soft.     Tenderness: There is no abdominal tenderness. There is no guarding.  Musculoskeletal:        General: No tenderness.  Skin:    General: Skin is warm and dry.     Findings: No erythema or rash.  Neurological:     Mental Status: She is alert and oriented to person, place, and time.      ED Treatments / Results  Labs (all labs ordered are listed, but only abnormal results are displayed) Labs Reviewed  BASIC METABOLIC PANEL - Abnormal; Notable for the following components:      Result Value   Glucose, Bld 176 (*)    GFR calc non Af Amer 58 (*)    All other components within normal limits  CBC - Abnormal;  Notable for the following components:   Hemoglobin 11.8 (*)    MCH 25.5 (*)    All other components within normal limits  D-DIMER, QUANTITATIVE (NOT AT [facility]) - Abnormal; Notable for the following components:   D-Dimer, Quant 2.27 (*)    All other components within normal limits  HEPATIC FUNCTION PANEL  LIPASE, BLOOD  HEMOGLOBIN A1C  TROPONIN I  TROPONIN I  TROPONIN I  LIPID PANEL  TSH  CBC  BASIC METABOLIC PANEL  MAGNESIUM  I-STAT TROPONIN, ED    EKG EKG Interpretation  Date/Time:  Tuesday March 17 2018 09:36:17 EST Ventricular Rate:  99 PR Interval:    QRS Duration: 81 QT Interval:  358 QTC Calculation: 460 R Axis:   -24 Text Interpretation:  Sinus rhythm Prolonged PR interval Borderline left axis deviation Nonspecific T abnormalities, lateral leads Baseline wander in lead(s) V6 No significant change since last tracing Confirmed by Alvira Monday [phone]) on 03/17/2018 10:40:36 AM   Radiology Dg Chest 2 View  Result Date: 03/17/2018 CLINICAL DATA:  Chest pain and shortness of breath EXAM: CHEST - 2 VIEW COMPARISON:  May 14, 2016 FINDINGS: There is no edema or consolidation. The heart size and pulmonary vascularity are within normal limits. There is aortic atherosclerosis. No adenopathy. There is mild degenerative change in thoracic spine. IMPRESSION: No edema or consolidation. Stable cardiac silhouette. There is aortic atherosclerosis. Aortic Atherosclerosis (ICD10-I70.0). Electronically Signed   By: Bretta Bang III M.D.   On: 03/17/2018 10:36   Ct Head Wo Contrast  Result Date: 03/17/2018 CLINICAL DATA:  Headache with vertigo EXAM: CT HEAD WITHOUT CONTRAST TECHNIQUE: Contiguous axial images were obtained from the base of the skull through the vertex without intravenous contrast. COMPARISON:  Head CT July 02, 2017 and brain MRI Aug 11, 2016 FINDINGS: Brain: There is age related volume loss. There is a lipoma arising from the falx anteriorly measuring 6 x 5  mm, stable. There is no other evident mass. There is no hemorrhage, extra-axial fluid collection, or midline shift. There is slight small vessel disease in the centra semiovale bilaterally. No acute infarct evident. Vascular: No hyperdense vessel. There is calcification in each carotid siphon region. Skull: The bony calvarium appears intact. Sinuses/Orbits: There is mucosal thickening in several ethmoid air cells. Other visualized paranasal sinuses are clear. Visualized orbits appear symmetric bilaterally. Other: Visualized mastoid air cells are clear. IMPRESSION: Age related volume loss with mild periventricular small vessel disease. No acute infarct. Subcentimeter anterior falcine lipoma, a benign finding which is stable. No evident hemorrhage. There are foci of arterial vascular calcification. There is mucosal thickening in several ethmoid air cells. Electronically Signed   By: Bretta Bang III M.D.   On: 03/17/2018 15:13   Ct Angio Chest Pe W And/or Wo Contrast  Result Date: 03/17/2018 CLINICAL DATA:  Chest pain, shortness of breath EXAM: CT ANGIOGRAPHY CHEST WITH CONTRAST TECHNIQUE: Multidetector CT imaging of the chest was performed using the standard protocol during bolus administration of intravenous contrast. Multiplanar CT image reconstructions and MIPs were obtained to evaluate the vascular anatomy. CONTRAST:  ISOVUE-370 IOPAMIDOL (ISOVUE-370) INJECTION 76% COMPARISON:  None. FINDINGS: Cardiovascular: Satisfactory opacification of the pulmonary arteries to the segmental level. No evidence of pulmonary embolism. Normal heart size. No pericardial effusion. Thoracic aorta is normal in caliber. No thoracic aortic dissection. Mediastinum/Nodes: No enlarged mediastinal, hilar, or axillary lymph nodes. Thyroid gland, trachea, and esophagus demonstrate no significant findings. Lungs/Pleura: Lungs are clear. No pleural effusion or pneumothorax. Upper Abdomen: No acute abnormality. Musculoskeletal:  No acute osseous abnormality. No aggressive osseous lesion. Mild degenerative disc disease throughout the thoracic spine. Review of the MIP images confirms the above findings. IMPRESSION: 1. No evidence of pulmonary embolus. 2. No acute cardiopulmonary disease. Electronically Signed   By: Elige Ko   On: 03/17/2018 15:16    Procedures Procedures (including critical care time)  Medications Ordered in ED Medications  sodium chloride (PF) 0.9 % injection (has no administration in time range)  iopamidol (ISOVUE-370) 76 % injection (has no administration in time range)  acetaminophen (TYLENOL)  tablet 1,000 mg (has no administration in time range)  amLODipine (NORVASC) tablet 5 mg (has no administration in time range)  montelukast (SINGULAIR) tablet 10 mg (has no administration in time range)  MULTIVITAMIN WOMEN 50+ TABS 1 tablet (has no administration in time range)  quinapril (ACCUPRIL) tablet 20 mg (has no administration in time range)  traMADol (ULTRAM) tablet 50 mg (has no administration in time range)  hydrALAZINE (APRESOLINE) injection 5 mg (has no administration in time range)  aspirin chewable tablet 325 mg (325 mg Oral Given 03/17/18 1131)  iopamidol (ISOVUE-370) 76 % injection 100 mL (100 mLs Intravenous Contrast Given 03/17/18 1449)     Initial Impression / Assessment and Plan / ED Course  I have reviewed the triage vital signs and the nursing notes.  Pertinent labs & imaging results that were available during my care of the patient were reviewed by me and considered in my medical decision making (see chart for details).     84yo female with a history of htn, elevated BMI, presents with concern for chest pain.  Pt also reports intermittent dizziness, has normal neuro exam, has been coming and going--given headache, screening CT done and negative.  Do not feel hx consistent with TIA. Differential diagnosis for chest pain includes pulmonary embolus, dissection, pneumothorax,  pneumonia, ACS, myocarditis, pericarditis.  EKG was done and evaluate by me and showed no acute ST changes and no signs of pericarditis.. Chest x-ray was done and evaluated by me and radiology and showed no sign of pneumonia or pneumothorax. DDimer positive and PE scan done showing no sign of PE. Doubt dissection by history and physical exam.  Patient is high risk HEART score, will admit for further chest pain rule out. Consulted Cardiology. Hospitalist to admit.   Final Clinical Impressions(s) / ED Diagnoses   Final diagnoses:  Chest pain with high risk for cardiac etiology    ED Discharge Orders    None       Alvira Monday, MD 03/17/18 1819

## 2018-03-17 NOTE — ED Triage Notes (Signed)
Pt presents with c/o chest pain that she reports has been off and on for a week but has been worse in the evening and this morning. Pt reports some shortness of breath with the chest pain as well as well as bilateral arm pain.

## 2018-03-17 NOTE — H&P (Signed)
History and Physical  [name] UYQ:[id number] DOB: 1934-01-25 DOA: 03/17/2018  Referring physician: EDP PCP: Catha Gosselin, MD   Chief Complaint: chest pain  HPI: Melissa Arnold is a 84 y.o. female   H/o HTN, allergic rhinitis on singular/flonase, h/o GERD presented to the ED due to intermittent chest pain.  Patient reports she has been having intermittent chest pain since her husband passed away a year ago, reported chest pain became more frequent last week, yesterday she is started have chest pain with short of breath.  She decided to come to the ED for further evaluation.  ED course: Blood pressure is elevated systolic blood pressure 160, no hypoxia, no fever.  EKG no acute ST-T changes, troponin negative, basic labs including CBC CMP unremarkable ,CTA negative for PE.  EDP contacted cardiology Dr. Mayford Knife who recommended admit to hospitalist service for chest pain rule out.   Review of Systems:  Detail per HPI, Review of systems are otherwise negative  Past Medical History:  Diagnosis Date  . Allergy   . Arthritis   . Cataract   . GERD (gastroesophageal reflux disease)   . Hypertension   . Osteoporosis   . Vitamin D deficiency    Past Surgical History:  Procedure Laterality Date  . CHOLECYSTECTOMY    . EYE SURGERY     Social History:  reports that she has never smoked. She has never used smokeless tobacco. She reports that she does not drink alcohol or use drugs. Patient lives at home & is able to participate in activities of daily living independently   Allergies  Allergen Reactions  . Gabapentin Other (See Comments)    Woozy feeling    Family History  Problem Relation Age of Onset  . Hypertension Father   . Heart failure Father   . Heart disease Brother   . Heart failure Mother   . Asthma Mother   . Heart attack Neg Hx   . Stroke Neg Hx       Prior to Admission medications   Medication Sig Start Date End Date Taking? Authorizing Provider    acetaminophen (TYLENOL) 500 MG tablet Take 1,000 mg by mouth every 6 (six) hours as needed for mild pain.   Yes [provider]  amLODipine (NORVASC) 5 MG tablet Take 5 mg by mouth daily. 04/28/17  Yes [provider]  Aspirin-Salicylamide-Caffeine (BC HEADACHE POWDER PO) Take 1 each by mouth daily as needed (pain).   Yes [provider]  guaifenesin (ROBITUSSIN) 100 MG/5ML syrup Take 100 mg by mouth 3 (three) times daily as needed for cough.   Yes [provider]  hydrochlorothiazide (HYDRODIURIL) 25 MG tablet Take 25 mg by mouth daily.   Yes [provider]  montelukast (SINGULAIR) 10 MG tablet Take 10 mg by mouth every evening. 06/27/16  Yes [provider]  Multiple Vitamins-Minerals (MULTIVITAMIN WOMEN 50+) TABS Take 1 tablet by mouth daily.   Yes [provider]  quinapril (ACCUPRIL) 20 MG tablet Take 20 mg by mouth daily.  03/16/15  Yes [provider]  sodium-potassium bicarbonate (ALKA-SELTZER GOLD) TBEF dissolvable tablet Take 1 tablet by mouth daily as needed (heartburn).   Yes [provider]  traMADol (ULTRAM) 50 MG tablet Take 50 mg by mouth 3 (three) times daily as needed for moderate pain.  06/27/16  Yes [provider]  meloxicam (MOBIC) 7.5 MG tablet Take 1-2 tablets (7.5-15 mg total) by mouth daily. Patient not taking: Reported on 01/07/2018 05/20/17  Weber, Dema Severin, PA-C    Physical Exam: BP (!) 164/118   Pulse 92   Temp 98.5 F (36.9 C) (Oral)   Resp 18   SpO2 100%   General:  NAD Eyes: PERRL ENT: unremarkable Neck: supple, no JVD Cardiovascular: RRR Respiratory: CTABL Abdomen: soft/ND/ND, positive bowel sounds Skin: no rash Musculoskeletal:  No edema Psychiatric: calm/cooperative Neurologic: no focal findings            Labs on Admission:  Basic Metabolic Panel: Recent Labs  Lab 03/17/18 0936  NA 140  K 3.7  CL 105  CO2 26  GLUCOSE 176*  BUN 13  CREATININE 0.92   CALCIUM 9.1   Liver Function Tests: Recent Labs  Lab 03/17/18 0936  AST 25  ALT 17  ALKPHOS 63  BILITOT 0.7  PROT 6.8  ALBUMIN 3.7   Recent Labs  Lab 03/17/18 0936  LIPASE 40   No results for input(s): AMMONIA in the last 168 hours. CBC: Recent Labs  Lab 03/17/18 0936  WBC 7.0  HGB 11.8*  HCT 39.1  MCV 84.4  PLT 235   Cardiac Enzymes: No results for input(s): CKTOTAL, CKMB, CKMBINDEX, TROPONINI in the last 168 hours.  BNP (last 3 results) No results for input(s): BNP in the last 8760 hours.  ProBNP (last 3 results) No results for input(s): PROBNP in the last 8760 hours.  CBG: No results for input(s): GLUCAP in the last 168 hours.  Radiological Exams on Admission: Dg Chest 2 View  Result Date: 03/17/2018 CLINICAL DATA:  Chest pain and shortness of breath EXAM: CHEST - 2 VIEW COMPARISON:  May 14, 2016 FINDINGS: There is no edema or consolidation. The heart size and pulmonary vascularity are within normal limits. There is aortic atherosclerosis. No adenopathy. There is mild degenerative change in thoracic spine. IMPRESSION: No edema or consolidation. Stable cardiac silhouette. There is aortic atherosclerosis. Aortic Atherosclerosis (ICD10-I70.0). Electronically Signed   By: Bretta Bang III M.D.   On: 03/17/2018 10:36   Ct Head Wo Contrast  Result Date: 03/17/2018 CLINICAL DATA:  Headache with vertigo EXAM: CT HEAD WITHOUT CONTRAST TECHNIQUE: Contiguous axial images were obtained from the base of the skull through the vertex without intravenous contrast. COMPARISON:  Head CT July 02, 2017 and brain MRI Aug 11, 2016 FINDINGS: Brain: There is age related volume loss. 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 There is no other evident mass. There is no hemorrhage, extra-axial fluid collection, or midline shift. There is slight small vessel disease in the centra semiovale bilaterally. No acute infarct evident. Vascular: No  hyperdense vessel. There is calcification in each carotid siphon region. Skull: The bony calvarium appears intact. Sinuses/Orbits: There is mucosal thickening in several ethmoid air cells. Other visualized paranasal sinuses are clear. Visualized orbits appear symmetric bilaterally. Other: Visualized mastoid air cells are clear. IMPRESSION: Age related volume loss with mild periventricular small vessel disease. No acute infarct. Subcentimeter anterior falcine lipoma, a benign finding which is stable. No evident hemorrhage. There are foci of arterial vascular calcification. There is mucosal thickening in several ethmoid air cells. Electronically Signed   By: Bretta Bang III M.D.   On: 03/17/2018 15:13   Ct Angio Chest Pe W And/or Wo Contrast  Result Date: 03/17/2018 CLINICAL DATA:  Chest pain, shortness of breath EXAM: CT ANGIOGRAPHY CHEST WITH CONTRAST TECHNIQUE: Multidetector CT imaging of the chest was performed using the standard protocol during bolus administration of intravenous contrast.  Multiplanar CT image reconstructions and MIPs were obtained to evaluate the vascular anatomy. CONTRAST:  ISOVUE-370 IOPAMIDOL (ISOVUE-370) INJECTION 76% COMPARISON:  None. FINDINGS: Cardiovascular: Satisfactory opacification of the pulmonary arteries to the segmental level. No evidence of pulmonary embolism. Normal heart size. No pericardial effusion. Thoracic aorta is normal in caliber. No thoracic aortic dissection. Mediastinum/Nodes: No enlarged mediastinal, hilar, or axillary lymph nodes. Thyroid gland, trachea, and esophagus demonstrate no significant findings. Lungs/Pleura: Lungs are clear. No pleural effusion or pneumothorax. Upper Abdomen: No acute abnormality. Musculoskeletal: No acute osseous abnormality. No aggressive osseous lesion. Mild degenerative disc disease throughout the thoracic spine. Review of the MIP images confirms the above findings. IMPRESSION: 1. No evidence of pulmonary embolus. 2.  No acute cardiopulmonary disease. Electronically Signed   By: Elige Ko   On: 03/17/2018 15:16      Assessment/Plan Present on Admission: **None**  Chest pain with sob -negative PE on CTA -Per chart review she had a negative stress test in 2017 -Risk factor including hypertension, obesity -Observation telemetry, continue cycle troponin, echocardiogram, check fasting lipid panel and A1c -N.p.o. after midnight in case of stress test, cardiology consulted.  HTN:  She reports systolic blood pressure at home is around 140. She feels dizzy if her systolic blood pressure around 110s, her primary care physician has been trying to taper her blood pressure medication  Morbid obesity: Body mass index is 42.15 kg/m.  DVT prophylaxis: lovenox  Consultants:  Cardiology   Code Status: full   Family Communication:  Patient   Disposition Plan: med tele obs  Time spent:  Albertine Grates MD, PhD [facility] Pager [phone] If 7PM-7AM, please contact night-coverage at [email or web address], password [facility]

## 2018-03-18 ENCOUNTER — Other Ambulatory Visit: Payer: Self-pay

## 2018-03-18 ENCOUNTER — Observation Stay (HOSPITAL_BASED_OUTPATIENT_CLINIC_OR_DEPARTMENT_OTHER): Payer: Medicare Other

## 2018-03-18 ENCOUNTER — Other Ambulatory Visit (HOSPITAL_COMMUNITY): Payer: Medicare Other

## 2018-03-18 ENCOUNTER — Other Ambulatory Visit: Payer: Self-pay | Admitting: Physician Assistant

## 2018-03-18 DIAGNOSIS — R079 Chest pain, unspecified: Secondary | ICD-10-CM | POA: Diagnosis not present

## 2018-03-18 DIAGNOSIS — K219 Gastro-esophageal reflux disease without esophagitis: Secondary | ICD-10-CM | POA: Diagnosis not present

## 2018-03-18 DIAGNOSIS — I10 Essential (primary) hypertension: Secondary | ICD-10-CM | POA: Diagnosis not present

## 2018-03-18 DIAGNOSIS — R072 Precordial pain: Secondary | ICD-10-CM

## 2018-03-18 LAB — CBC
HCT: 39.1 % (ref 36.0–46.0)
Hemoglobin: 11.8 g/dL — ABNORMAL LOW (ref 12.0–15.0)
MCH: 25.7 pg — ABNORMAL LOW (ref 26.0–34.0)
MCHC: 30.2 g/dL (ref 30.0–36.0)
MCV: 85 fL (ref 80.0–100.0)
Platelets: 241 10*3/uL (ref 150–400)
RBC: 4.6 MIL/uL (ref 3.87–5.11)
RDW: 15.8 % — ABNORMAL HIGH (ref 11.5–15.5)
WBC: 7.9 10*3/uL (ref 4.0–10.5)
nRBC: 0 % (ref 0.0–0.2)

## 2018-03-18 LAB — ECHOCARDIOGRAM COMPLETE
Height: 60 in
Weight: 3452.8 oz

## 2018-03-18 LAB — BASIC METABOLIC PANEL
Anion gap: 10 (ref 5–15)
BUN: 11 mg/dL (ref 8–23)
CO2: 26 mmol/L (ref 22–32)
Calcium: 9.2 mg/dL (ref 8.9–10.3)
Chloride: 107 mmol/L (ref 98–111)
Creatinine, Ser: 0.8 mg/dL (ref 0.44–1.00)
GFR calc Af Amer: >60 mL/min (ref >60)
GFR calc non Af Amer: >60 mL/min (ref >60)
Glucose, Bld: 109 mg/dL — ABNORMAL HIGH (ref 70–99)
Potassium: 4 mmol/L (ref 3.5–5.1)
Sodium: 143 mmol/L (ref 135–145)

## 2018-03-18 LAB — MAGNESIUM: MAGNESIUM: 2.1 mg/dL (ref 1.7–2.4)

## 2018-03-18 LAB — LIPID PANEL
CHOLESTEROL: 171 mg/dL (ref 0–200)
HDL: 71 mg/dL (ref >40)
LDL Cholesterol: 69 mg/dL (ref 0–99)
Total CHOL/HDL Ratio: 2.4 RATIO
Triglycerides: 153 mg/dL — ABNORMAL HIGH (ref <150)
VLDL: 31 mg/dL (ref 0–40)

## 2018-03-18 LAB — TROPONIN I

## 2018-03-18 NOTE — Progress Notes (Signed)
Echocardiogram Complete  Rheagan Nayak, RDCS 

## 2018-03-18 NOTE — Discharge Summary (Signed)
Physician Discharge Summary  Melissa LeydenGenevieve E Brittingham ZOX:[id number] DOB: 09-28-1933 DOA: 03/17/2018  PCP: Catha GosselinLittle, Kevin, MD  Admit date: 03/17/2018 Discharge date: 03/18/2018  Admitted From: Home Disposition:  Home  Discharge Condition:Stable CODE STATUS:FULL Diet recommendation: Heart Healthy  Brief/Interim Summary:  HPI: Melissa Arnold is a 85 y.o. female  H/o HTN, allergic rhinitis on singular/flonase, h/o GERD presented to the ED due to intermittent chest pain.  Patient reports she has been having intermittent chest pain since her husband passed away a year ago, reported chest pain became more frequent last week, yesterday she is started have chest pain with short of breath.  She decided to come to the ED for further evaluation. ED course: Blood pressure is elevated systolic blood pressure 160, no hypoxia, no fever.  EKG no acute ST-T changes, troponin negative, basic labs including CBC CMP unremarkable ,CTA negative for PE.  EDP contacted cardiology Dr. Mayford Knifeurner who recommended admit to hospitalist service for chest pain rule out.   Hospital Course: Patient's hospital course remained stable.  Patient's chest pain has resolved this morning.Her chest pain was atypical.  Troponins have been negative.  CT chest did not show any acute process.  EKG had shown just nonspecific change in the lateral T waves. Patient evaluated by cardiology today.  Echocardiogram done and it showed normal left ventricular ejection fraction of 60 to 65%, no wall motion abnormality, grade 1 diastolic dysfunction. Patient is stable for discharge home today.  She will follow-up with cardiology as an outpatient for  nuclear stress test.  Problems were addressed during her hospitalization:  Chest pain with sob -negative PE on CTA -Per chart review she had a negative stress test in 2017 -Risk factor including hypertension, obesity -Chest pain resolved this morning. -Echocardiogram as above. -Follow-up with cardiology  as an outpatient for nuclear stress test. -LDL of 69 and hemoglobin A1c of 5.8.  HTN:  -She reports systolic blood pressure at home is around 140. -She feels dizzy if her systolic blood pressure around 110s, her primary care physician has been trying to taper her blood pressure medication. -Monitor BP at home.  Morbid obesity: -Body mass index is 42.15 kg/m.   Discharge Diagnoses:  Active Problems:   Chest pain   Obesity, Class III, BMI 40-49.9 (morbid obesity) (HCC)    Discharge Instructions  Discharge Instructions    Diet - low sodium heart healthy   Complete by:  As directed    Discharge instructions   Complete by:  As directed    1)Follow up for the nuclear stress as an outpatient.   Increase activity slowly   Complete by:  As directed      Allergies as of 03/18/2018      Reactions   Gabapentin Other (See Comments)   Woozy feeling      Medication List    TAKE these medications   acetaminophen 500 MG tablet Commonly known as:  TYLENOL Take 1,000 mg by mouth every 6 (six) hours as needed for mild pain.   amLODipine 5 MG tablet Commonly known as:  NORVASC Take 5 mg by mouth daily.   BC HEADACHE POWDER PO Take 1 each by mouth daily as needed (pain).   guaifenesin 100 MG/5ML syrup Commonly known as:  ROBITUSSIN Take 100 mg by mouth 3 (three) times daily as needed for cough.   hydrochlorothiazide 25 MG tablet Commonly known as:  HYDRODIURIL Take 25 mg by mouth daily.   meloxicam 7.5 MG tablet Commonly known as:  MOBIC  Take 1-2 tablets (7.5-15 mg total) by mouth daily.   montelukast 10 MG tablet Commonly known as:  SINGULAIR Take 10 mg by mouth every evening.   MULTIVITAMIN WOMEN 50+ Tabs Take 1 tablet by mouth daily.   quinapril 20 MG tablet Commonly known as:  ACCUPRIL Take 20 mg by mouth daily.   sodium-potassium bicarbonate Tbef dissolvable tablet Commonly known as:  ALKA-SELTZER GOLD Take 1 tablet by mouth daily as needed (heartburn).    traMADol 50 MG tablet Commonly known as:  ULTRAM Take 50 mg by mouth 3 (three) times daily as needed for moderate pain.      Follow-up Information    Little, Caryn Bee, MD. Schedule an appointment as soon as possible for a visit in 1 week(s).   Specialty:  Family Medicine Contact information: [address] [place] [address] [phone]          Allergies  Allergen Reactions  . Gabapentin Other (See Comments)    Woozy feeling    Consultations:  Cardiology   Procedures/Studies: Dg Chest 2 View  Result Date: 03/17/2018 CLINICAL DATA:  Chest pain and shortness of breath EXAM: CHEST - 2 VIEW COMPARISON:  May 14, 2016 FINDINGS: There is no edema or consolidation. The heart size and pulmonary vascularity are within normal limits. There is aortic atherosclerosis. No adenopathy. There is mild degenerative change in thoracic spine. IMPRESSION: No edema or consolidation. Stable cardiac silhouette. There is aortic atherosclerosis. Aortic Atherosclerosis (ICD10-I70.0). Electronically Signed   By: Bretta Bang III M.D.   On: 03/17/2018 10:36   Ct Head Wo Contrast  Result Date: 03/17/2018 CLINICAL DATA:  Headache with vertigo EXAM: CT HEAD WITHOUT CONTRAST TECHNIQUE: Contiguous axial images were obtained from the base of the skull through the vertex without intravenous contrast. COMPARISON:  Head CT July 02, 2017 and brain MRI Aug 11, 2016 FINDINGS: Brain: There is age related volume loss. 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 There is no other evident mass. There is no hemorrhage, extra-axial fluid collection, or midline shift. There is slight small vessel disease in the centra semiovale bilaterally. No acute infarct evident. Vascular: No hyperdense vessel. There is calcification in each carotid siphon region. Skull: The bony calvarium appears intact. Sinuses/Orbits: There is mucosal thickening in several ethmoid air cells. Other  visualized paranasal sinuses are clear. Visualized orbits appear symmetric bilaterally. Other: Visualized mastoid air cells are clear. IMPRESSION: Age related volume loss with mild periventricular small vessel disease. No acute infarct. Subcentimeter anterior falcine lipoma, a benign finding which is stable. No evident hemorrhage. There are foci of arterial vascular calcification. There is mucosal thickening in several ethmoid air cells. Electronically Signed   By: Bretta Bang III M.D.   On: 03/17/2018 15:13   Ct Angio Chest Pe W And/or Wo Contrast  Result Date: 03/17/2018 CLINICAL DATA:  Chest pain, shortness of breath EXAM: CT ANGIOGRAPHY CHEST WITH CONTRAST TECHNIQUE: Multidetector CT imaging of the chest was performed using the standard protocol during bolus administration of intravenous contrast. Multiplanar CT image reconstructions and MIPs were obtained to evaluate the vascular anatomy. CONTRAST:  ISOVUE-370 IOPAMIDOL (ISOVUE-370) INJECTION 76% COMPARISON:  None. FINDINGS: Cardiovascular: Satisfactory opacification of the pulmonary arteries to the segmental level. No evidence of pulmonary embolism. Normal heart size. No pericardial effusion. Thoracic aorta is normal in caliber. No thoracic aortic dissection. Mediastinum/Nodes: No enlarged mediastinal, hilar, or axillary lymph nodes. Thyroid gland, trachea, and esophagus demonstrate no significant findings.  Lungs/Pleura: Lungs are clear. No pleural effusion or pneumothorax. Upper Abdomen: No acute abnormality. Musculoskeletal: No acute osseous abnormality. No aggressive osseous lesion. Mild degenerative disc disease throughout the thoracic spine. Review of the MIP images confirms the above findings. IMPRESSION: 1. No evidence of pulmonary embolus. 2. No acute cardiopulmonary disease. Electronically Signed   By: Elige KoHetal  Patel   On: 03/17/2018 15:16      Subjective: Patient seen and examined the bedside this morning.  Remains comfortable.   Denies any chest pain during my evaluation.  Discussed discharge planning with the daughter.  Discharge Exam: Vitals:   03/18/18 0515 03/18/18 1341  BP: 120/80 127/75  Pulse: 73 71  Resp: 12 12  Temp: 98.9 F (37.2 C) 98.2 F (36.8 C)  SpO2: 95% 95%   Vitals:   03/17/18 1830 03/17/18 2027 03/18/18 0515 03/18/18 1341  BP: (!) 178/97 (!) 152/87 120/80 127/75  Pulse: 93 79 73 71  Resp: 15 14 12 12   Temp: 98.5 F (36.9 C) 98.8 F (37.1 C) 98.9 F (37.2 C) 98.2 F (36.8 C)  TempSrc: Oral Oral Oral Oral  SpO2: 94% 98% 95% 95%  Weight: 97.9 kg     Height: 5' (1.524 m)       General: Pt is alert, awake, not in acute distress Cardiovascular: RRR, S1/S2 +, no rubs, no gallops Respiratory: CTA bilaterally, no wheezing, no rhonchi Abdominal: Soft, NT, ND, bowel sounds + Extremities: no edema, no cyanosis    The results of significant diagnostics from this hospitalization (including imaging, microbiology, ancillary and laboratory) are listed below for reference.     Microbiology: No results found for this or any previous visit (from the past 240 hour(s)).   Labs: BNP (last 3 results) No results for input(s): BNP in the last 8760 hours. Basic Metabolic Panel: Recent Labs  Lab 03/17/18 0936 03/18/18 0447  NA 140 143  K 3.7 4.0  CL 105 107  CO2 26 26  GLUCOSE 176* 109*  BUN 13 11  CREATININE 0.92 0.80  CALCIUM 9.1 9.2  MG  --  2.1   Liver Function Tests: Recent Labs  Lab 03/17/18 0936  AST 25  ALT 17  ALKPHOS 63  BILITOT 0.7  PROT 6.8  ALBUMIN 3.7   Recent Labs  Lab 03/17/18 0936  LIPASE 40   No results for input(s): AMMONIA in the last 168 hours. CBC: Recent Labs  Lab 03/17/18 0936 03/18/18 0447  WBC 7.0 7.9  HGB 11.8* 11.8*  HCT 39.1 39.1  MCV 84.4 85.0  PLT 235 241   Cardiac Enzymes: Recent Labs  Lab 03/17/18 1951 03/17/18 2249 03/18/18 0447  TROPONINI <0.03 <0.03 <0.03   BNP: Invalid input(s): POCBNP CBG: No results for input(s):  GLUCAP in the last 168 hours. D-Dimer Recent Labs    03/17/18 0936  DDIMER 2.27*   Hgb A1c Recent Labs    03/17/18 1951  HGBA1C 5.8*   Lipid Profile Recent Labs    03/18/18 0447  CHOL 171  HDL 71  LDLCALC 69  TRIG 153*  CHOLHDL 2.4   Thyroid function studies Recent Labs    03/17/18 1951  TSH 0.931   Anemia work up No results for input(s): VITAMINB12, FOLATE, FERRITIN, TIBC, IRON, RETICCTPCT in the last 72 hours. Urinalysis    Component Value Date/Time   BILIRUBINUR negative 06/30/2017 1429   KETONESUR negative 06/30/2017 1429   PROTEINUR negative 06/30/2017 1429   UROBILINOGEN 0.2 06/30/2017 1429   NITRITE Negative 06/30/2017 1429  LEUKOCYTESUR Negative 06/30/2017 1429   Sepsis Labs Invalid input(s): PROCALCITONIN,  WBC,  LACTICIDVEN Microbiology No results found for this or any previous visit (from the past 240 hour(s)).  Please note: You were cared for by a hospitalist during your hospital stay. Once you are discharged, your primary care physician will handle any further medical issues. Please note that NO REFILLS for any discharge medications will be authorized once you are discharged, as it is imperative that you return to your primary care physician (or establish a relationship with a primary care physician if you do not have one) for your post hospital discharge needs so that they can reassess your need for medications and monitor your lab values.    Time coordinating discharge: 40 minutes  SIGNED:   Burnadette Pop, MD  [facility] 03/18/2018, 3:15 PM Pager [phone]  If 7PM-7AM, please contact night-coverage [email or web address] Password [email or web address]

## 2018-03-18 NOTE — Consult Note (Signed)
Cardiology Consultation:   Patient ID: Melissa Arnold MRN: [id number]; DOB: 10/15/1933  Admit date: 03/17/2018 Date of Consult: 03/18/2018  Primary Care Provider: Catha Gosselin, MD Primary Cardiologist: No primary care provider on file. Primary Electrophysiologist:  None    Patient Profile:   Melissa Arnold is a 85 y.o. female with a hx of GERD and hypertension who is being seen today for the evaluation of chest pain at the request of Dr. Parke Poisson.  History of Present Illness:   Melissa Arnold is an 85 year old woman with a history of hypertension and GERD who presented to the [facility] long ED with chest pain.  She began developing retrosternal chest pain about 1 week ago.  She also experienced symptoms underneath the right breast and on the lateral aspect of both breasts.  She had some mild associated shortness of breath.  She denies having symptoms like this before.  She denies orthopnea, leg swelling, paroxysmal nocturnal dyspnea, palpitations, and syncope.  Her daughter, Melissa Arnold, is also present.  Melissa Arnold said that her father passed away a year ago and this is the first time her mother has had to live alone.  She has had some more baseline anxiety since that time.  Linda checks on her on a daily basis.  She said it takes her mother longer to make decisions.  The patient says that her symptoms have subsided.  CBC showed mild anemia.  Troponins have been normal.  D-dimer elevated at 2.27.  Lipids showed mild hypertriglyceridemia.  TSH was also normal.  HbA1c 5.8%.  CT angiography of the chest showed no evidence of pulmonary embolism nor cardiopulmonary disease.  Head CT showed no acute infarct.  Chest x-ray showed aortic atherosclerosis.  Carotid Dopplers on 07/17/2017 showed 1 to 39% bilateral internal carotid artery stenosis.  I personally reviewed the ECG which demonstrated sinus rhythm with no acute ST segment or T wave abnormalities, nor any arrhythmias.      Past Medical  History:  Diagnosis Date  . Allergy   . Arthritis   . Cataract   . GERD (gastroesophageal reflux disease)   . Hypertension   . Osteoporosis   . Vitamin D deficiency     Past Surgical History:  Procedure Laterality Date  . CHOLECYSTECTOMY    . EYE SURGERY         Inpatient Medications: Scheduled Meds: . amLODipine  5 mg Oral Daily  . enoxaparin (LOVENOX) injection  40 mg Subcutaneous Q24H  . fluticasone  1 spray Each Nare Daily  . lisinopril  20 mg Oral Daily  . montelukast  10 mg Oral QPM  . multivitamin with minerals  1 tablet Oral Daily   Continuous Infusions:  PRN Meds: acetaminophen, hydrALAZINE, traMADol  Allergies:    Allergies  Allergen Reactions  . Gabapentin Other (See Comments)    Woozy feeling    Social History:   Social History   Socioeconomic History  . Marital status: Widowed    Spouse name: Not on file  . Number of children: 6  . Years of education: Not on file  . Highest education level: Not on file  Occupational History  . Not on file  Social Needs  . Financial resource strain: Not on file  . Food insecurity:    Worry: Not on file    Inability: Not on file  . Transportation needs:    Medical: Not on file    Non-medical: Not on file  Tobacco Use  . Smoking status: Never Smoker  .  Smokeless tobacco: Never Used  Substance and Sexual Activity  . Alcohol use: No    Alcohol/week: 0.0 standard drinks  . Drug use: No  . Sexual activity: Not on file  Lifestyle  . Physical activity:    Days per week: Not on file    Minutes per session: Not on file  . Stress: Not on file  Relationships  . Social connections:    Talks on phone: Not on file    Gets together: Not on file    Attends religious service: Not on file    Active member of club or organization: Not on file    Attends meetings of clubs or organizations: Not on file    Relationship status: Not on file  . Intimate partner violence:    Fear of current or ex partner: Not on file     Emotionally abused: Not on file    Physically abused: Not on file    Forced sexual activity: Not on file  Other Topics Concern  . Not on file  Social History Narrative   Lives at home alone   [facility]   Right handed   Caffeine: 1 cup coffee daily and 1 cup of soda occasionally with dinner    Family History:    Family History  Problem Relation Age of Onset  . Hypertension Father   . Heart failure Father   . Heart disease Brother   . Heart failure Mother   . Asthma Mother   . Heart attack Neg Hx   . Stroke Neg Hx      ROS:  Please see the history of present illness.   All other ROS reviewed and negative.     Physical Exam/Data:   Vitals:   03/17/18 1756 03/17/18 1830 03/17/18 2027 03/18/18 0515  BP: (!) 193/114 (!) 178/97 (!) 152/87 120/80  Pulse: 93 93 79 73  Resp: 14 15 14 12   Temp:  98.5 F (36.9 C) 98.8 F (37.1 C) 98.9 F (37.2 C)  TempSrc:  Oral Oral Oral  SpO2: 96% 94% 98% 95%  Weight:  97.9 kg    Height:  5' (1.524 m)     No intake or output data in the 24 hours ending 03/18/18 1154 Filed Weights   03/17/18 1830  Weight: 97.9 kg   Body mass index is 42.15 kg/m.  General:  Well nourished, well developed, in no acute distress HEENT: normal Lymph: no adenopathy Neck: no JVD Endocrine:  No thryomegaly Vascular: No carotid bruits  Cardiac:  normal S1, S2; RRR; no murmur  Lungs:  clear to auscultation bilaterally, no wheezing, rhonchi or rales  Abd: soft, nontender, no hepatomegaly  Ext: no edema Musculoskeletal:  No deformities, BUE and BLE strength normal and equal Skin: warm and dry  Neuro:  CNs 2-12 intact, no focal abnormalities noted Psych:  Normal affect   EKG:  The EKG was personally reviewed and demonstrates: Sinus rhythm without ischemic ST segment or T wave abnormalities. Telemetry:  Telemetry was personally reviewed and demonstrates: Sinus rhythm and sinus tachycardia  Relevant CV Studies: Echocardiogram ordered and  pending.  Nuclear stress test was low risk in April 2017.  Laboratory Data:  Chemistry Recent Labs  Lab 03/17/18 0936 03/18/18 0447  NA 140 143  K 3.7 4.0  CL 105 107  CO2 26 26  GLUCOSE 176* 109*  BUN 13 11  CREATININE 0.92 0.80  CALCIUM 9.1 9.2  GFRNONAA 58* >60  GFRAA >60 >60  ANIONGAP 9 10    Recent Labs  Lab 03/17/18 0936  PROT 6.8  ALBUMIN 3.7  AST 25  ALT 17  ALKPHOS 63  BILITOT 0.7   Hematology Recent Labs  Lab 03/17/18 0936 03/18/18 0447  WBC 7.0 7.9  RBC 4.63 4.60  HGB 11.8* 11.8*  HCT 39.1 39.1  MCV 84.4 85.0  MCH 25.5* 25.7*  MCHC 30.2 30.2  RDW 15.4 15.8*  PLT 235 241   Cardiac Enzymes Recent Labs  Lab 03/17/18 1951 03/17/18 2249 03/18/18 0447  TROPONINI <0.03 <0.03 <0.03    Recent Labs  Lab 03/17/18 1002  TROPIPOC 0.00    BNPNo results for input(s): BNP, PROBNP in the last 168 hours.  DDimer  Recent Labs  Lab 03/17/18 0936  DDIMER 2.27*    Radiology/Studies:  Dg Chest 2 View  Result Date: 03/17/2018 CLINICAL DATA:  Chest pain and shortness of breath EXAM: CHEST - 2 VIEW COMPARISON:  May 14, 2016 FINDINGS: There is no edema or consolidation. The heart size and pulmonary vascularity are within normal limits. There is aortic atherosclerosis. No adenopathy. There is mild degenerative change in thoracic spine. IMPRESSION: No edema or consolidation. Stable cardiac silhouette. There is aortic atherosclerosis. Aortic Atherosclerosis (ICD10-I70.0). Electronically Signed   By: Bretta Bang III M.D.   On: 03/17/2018 10:36   Ct Head Wo Contrast  Result Date: 03/17/2018 CLINICAL DATA:  Headache with vertigo EXAM: CT HEAD WITHOUT CONTRAST TECHNIQUE: Contiguous axial images were obtained from the base of the skull through the vertex without intravenous contrast. COMPARISON:  Head CT July 02, 2017 and brain MRI Aug 11, 2016 FINDINGS: Brain: There is age related volume loss. 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 There is no other evident mass. There is no hemorrhage, extra-axial fluid collection, or midline shift. There is slight small vessel disease in the centra semiovale bilaterally. No acute infarct evident. Vascular: No hyperdense vessel. There is calcification in each carotid siphon region. Skull: The bony calvarium appears intact. Sinuses/Orbits: There is mucosal thickening in several ethmoid air cells. Other visualized paranasal sinuses are clear. Visualized orbits appear symmetric bilaterally. Other: Visualized mastoid air cells are clear. IMPRESSION: Age related volume loss with mild periventricular small vessel disease. No acute infarct. Subcentimeter anterior falcine lipoma, a benign finding which is stable. No evident hemorrhage. There are foci of arterial vascular calcification. There is mucosal thickening in several ethmoid air cells. Electronically Signed   By: Bretta Bang III M.D.   On: 03/17/2018 15:13   Ct Angio Chest Pe W And/or Wo Contrast  Result Date: 03/17/2018 CLINICAL DATA:  Chest pain, shortness of breath EXAM: CT ANGIOGRAPHY CHEST WITH CONTRAST TECHNIQUE: Multidetector CT imaging of the chest was performed using the standard protocol during bolus administration of intravenous contrast. Multiplanar CT image reconstructions and MIPs were obtained to evaluate the vascular anatomy. CONTRAST:  ISOVUE-370 IOPAMIDOL (ISOVUE-370) INJECTION 76% COMPARISON:  None. FINDINGS: Cardiovascular: Satisfactory opacification of the pulmonary arteries to the segmental level. No evidence of pulmonary embolism. Normal heart size. No pericardial effusion. Thoracic aorta is normal in caliber. No thoracic aortic dissection. Mediastinum/Nodes: No enlarged mediastinal, hilar, or axillary lymph nodes. Thyroid gland, trachea, and esophagus demonstrate no significant findings. Lungs/Pleura: Lungs are clear. No pleural effusion or pneumothorax. Upper Abdomen: No acute abnormality.  Musculoskeletal: No acute osseous abnormality. No aggressive osseous lesion. Mild degenerative disc disease throughout the thoracic spine. Review of the MIP images confirms the above  findings. IMPRESSION: 1. No evidence of pulmonary embolus. 2. No acute cardiopulmonary disease. Electronically Signed   By: Elige KoHetal  Patel   On: 03/17/2018 15:16    Assessment and Plan:   1.  Chest pain: Symptoms appear atypical for ischemic heart disease.  Low risk nuclear stress test on 06/28/2015.  Echocardiogram has been ordered and is pending.  If left ventricular systolic function and regional wall motion are normal, I would recommend an outpatient nuclear stress test.  If there are significant abnormalities, a stress test could be performed on 03/19/2018.   As symptoms have subsided and in light of unremarkable testing thus far, she could likely be discharged today.  2.  Hypertension: Controlled on present therapy today.  No changes.  3.  GERD: Symptomatically stable.  For questions or updates, please contact [facility] Please consult [email or web address] for contact info under     Signed, Prentice DockerSuresh Koneswaran, MD  03/18/2018 11:54 AM

## 2018-03-25 ENCOUNTER — Ambulatory Visit (HOSPITAL_COMMUNITY): Payer: Medicare Other | Attending: Cardiovascular Disease

## 2018-03-25 DIAGNOSIS — R079 Chest pain, unspecified: Secondary | ICD-10-CM | POA: Diagnosis not present

## 2018-03-25 MED ORDER — TECHNETIUM TC 99M TETROFOSMIN IV KIT
31.7000 | PACK | Freq: Once | INTRAVENOUS | Status: AC | PRN
  Administered 2018-03-25: 31.7 via INTRAVENOUS
  Filled 2018-03-25: qty 32

## 2018-03-25 MED ORDER — REGADENOSON 0.4 MG/5ML IV SOLN
0.4000 mg | Freq: Once | INTRAVENOUS | Status: AC
  Administered 2018-03-25: 0.4 mg via INTRAVENOUS

## 2018-03-30 ENCOUNTER — Ambulatory Visit (HOSPITAL_COMMUNITY): Payer: Medicare Other | Attending: Cardiovascular Disease

## 2018-03-30 LAB — MYOCARDIAL PERFUSION IMAGING
LV dias vol: 42 mL (ref 46–106)
LV sys vol: 10 mL
Peak HR: 105 {beats}/min
Rest HR: 93 {beats}/min
SDS: 3
SRS: 0
SSS: 3
TID: 0.8

## 2018-03-30 MED ORDER — TECHNETIUM TC 99M TETROFOSMIN IV KIT
31.7000 | PACK | Freq: Once | INTRAVENOUS | Status: AC | PRN
  Administered 2018-03-30: 31.7 via INTRAVENOUS
  Filled 2018-03-30: qty 32

## 2018-04-01 ENCOUNTER — Encounter: Payer: Self-pay | Admitting: Cardiology

## 2018-04-02 ENCOUNTER — Ambulatory Visit (INDEPENDENT_AMBULATORY_CARE_PROVIDER_SITE_OTHER): Payer: Medicare Other | Admitting: Medical

## 2018-04-02 ENCOUNTER — Encounter: Payer: Self-pay | Admitting: Cardiology

## 2018-04-02 VITALS — BP 116/78 | HR 94 | Ht 60.0 in | Wt 218.4 lb

## 2018-04-02 DIAGNOSIS — I10 Essential (primary) hypertension: Secondary | ICD-10-CM | POA: Diagnosis not present

## 2018-04-02 DIAGNOSIS — R079 Chest pain, unspecified: Secondary | ICD-10-CM

## 2018-04-02 DIAGNOSIS — K219 Gastro-esophageal reflux disease without esophagitis: Secondary | ICD-10-CM

## 2018-04-02 NOTE — Patient Instructions (Signed)
Medication Instructions:  Your physician recommends that you continue on your current medications as directed. Please refer to the Current Medication list given to you today.  If you need a refill on your cardiac medications before your next appointment, please call your pharmacy.   Lab work: None  If you have labs (blood work) drawn today and your tests are completely normal, you will receive your results only by: . MyChart Message (if you have MyChart) OR . A paper copy in the mail If you have any lab test that is abnormal or we need to change your treatment, we will call you to review the results.  Testing/Procedures: None  Follow-Up: You can follow up with our office as needed  Any Other Special Instructions Will Be Listed Below (If Applicable).    

## 2018-04-02 NOTE — Progress Notes (Signed)
Cardiology Office Note   Date:  04/02/2018   ID:  Melissa LeydenGenevieve E Arnold, DOB [birth year], MRN [id number]  PCP:  Melissa Arnold, Kevin, MD  Cardiologist:  Melissa MussJayadeep Varanasi, MD EP: None  Chief Complaint  Patient presents with  . Hospitalization Follow-up    chest pain      History of Present Illness: Melissa Arnold is a 85 y.o. female with PMH of HTN and GERD, who was [facility] for [facility] pain who presents for follow-up of chest pain.  She was last seen by cardiology, Dr. Purvis SheffieldKoneswaran, while [facility] from 03/17/18-03/18/18 for the evaluation of chest pain. She had an echocardiogram which revealed EF 60-65%, no wall motion abnormalities, and G1DD. Pain was felt to be atypical and she was recommended for an outpatient NST which occurred 03/30/18 and was without evidence of ischemia.   She presents today, with her son, for follow-up of her chest pain. She reports improvement in her chest pain since discharge from the hospital. She does note some increased reflux at this time. No complaints of SOB or DOE. She reports being instructed to take her blood pressure medications as long as her SBP was >120, although she has not needed to hold her medications in recent weeks. She does report occasional dizziness, at which time she checks her blood pressure but has never had SBP <110s. She reports that this occurs on days when she does not get out of the house and thinks there may be an anxiety component to her dizziness. No complaints of syncope, orthopnea, PND, or changes in chronic LE edema.    Past Medical History:  Diagnosis Date  . Allergy   . Arthritis   . Cataract   . GERD (gastroesophageal reflux disease)   . Hypertension   . Osteoporosis   . Vitamin D deficiency     Past Surgical History:  Procedure Laterality Date  . CHOLECYSTECTOMY    . EYE SURGERY       Current Outpatient Medications  Medication Sig Dispense Refill  . acetaminophen (TYLENOL) 500  MG tablet Take 1,000 mg by mouth every 6 (six) hours as needed for mild pain.    Marland Kitchen. amLODipine (NORVASC) 5 MG tablet Take 5 mg by mouth daily.  2  . Aspirin-Salicylamide-Caffeine (BC HEADACHE POWDER PO) Take 1 each by mouth daily as needed (pain).    Marland Kitchen. guaifenesin (ROBITUSSIN) 100 MG/5ML syrup Take 100 mg by mouth 3 (three) times daily as needed for cough.    . hydrochlorothiazide (HYDRODIURIL) 25 MG tablet Take 25 mg by mouth daily.    . montelukast (SINGULAIR) 10 MG tablet Take 10 mg by mouth every evening.  5  . Multiple Vitamins-Minerals (MULTIVITAMIN WOMEN 50+) TABS Take 1 tablet by mouth daily.    . quinapril (ACCUPRIL) 20 MG tablet Take 20 mg by mouth daily.   1  . sodium-potassium bicarbonate (ALKA-SELTZER GOLD) TBEF dissolvable tablet Take 1 tablet by mouth daily as needed (heartburn).    . traMADol (ULTRAM) 50 MG tablet Take 50 mg by mouth 3 (three) times daily as needed for moderate pain.   0   No current facility-administered medications for this visit.     Allergies:   Gabapentin    Social History:  The patient  reports that she has never smoked. She has never used smokeless tobacco. She reports that she does not drink alcohol or use drugs.   Family History:  The patient's family history includes Asthma in  her mother; Heart disease in her brother; Heart failure in her father and mother; Hypertension in her father.    ROS:  Please see the history of present illness.   Otherwise, review of systems are positive for none.   All other systems are reviewed and negative.    PHYSICAL EXAM: VS:  BP 116/78   Pulse 94   Ht 5' (1.524 m)   Wt 218 lb 6.4 oz (99.1 kg)   SpO2 95%   BMI 42.65 kg/m  , BMI Body mass index is 42.65 kg/m. GEN: Well nourished, well developed, in no acute distress HEENT: sclera anicteric Neck: no JVD, carotid bruits, or masses Cardiac: RRR; no murmurs, rubs, or gallops, no edema  Respiratory:  clear to auscultation bilaterally, normal work of  breathing GI: soft, obese, nontender, nondistended, + BS MS: no deformity or atrophy Skin: warm and dry, no rash Neuro:  Strength and sensation are intact Psych: euthymic mood, full affect   EKG:  EKG is not ordered today.   Recent Labs: 03/17/2018: ALT 17; TSH 0.931 03/18/2018: BUN 11; Creatinine, Ser 0.80; Hemoglobin 11.8; Magnesium 2.1; Platelets 241; Potassium 4.0; Sodium 143    Lipid Panel    Component Value Date/Time   CHOL 171 03/18/2018 0447   TRIG 153 (H) 03/18/2018 0447   HDL 71 03/18/2018 0447   CHOLHDL 2.4 03/18/2018 0447   VLDL 31 03/18/2018 0447   LDLCALC 69 03/18/2018 0447      Wt Readings from Last 3 Encounters:  04/02/18 218 lb 6.4 oz (99.1 kg)  03/25/18 215 lb (97.5 kg)  03/17/18 215 lb 12.8 oz (97.9 kg)      Other studies Reviewed: Additional studies/ records that were reviewed today include:   Echocardiogram 03/18/18: Study Conclusions  - Left ventricle: The cavity size was normal. Systolic function was   normal. The estimated ejection fraction was in the range of 60%   to 65%. Wall motion was normal; there were no regional wall   motion abnormalities. Doppler parameters are consistent with   abnormal left ventricular relaxation (grade 1 diastolic   dysfunction). - Aortic valve: Trileaflet; moderately thickened, moderately   calcified leaflets. There was trivial regurgitation. Valve area   (VTI): 2.73 cm^2. Valve area (Vmax): 2.59 cm^2. Valve area   (Vmean): 2.52 cm^2.  NST 03/30/18:  Nuclear stress EF: 76%.  There was no ST segment deviation noted during stress.  No T wave inversion was noted during stress.  The study is normal.  This is a low risk study.  The left ventricular ejection fraction is hyperdynamic (>65%).    ASSESSMENT AND PLAN:  1. Atypical chest pain: Echo with EF 60-65% and no wall motion abnormalities. NST 03/30/18 without evidence of ischemia. She reports no significant chest pain since discharge from the  hospital. Possible this was GERD related as well as a component of anxiety since the passing of her husband and her daughter moving to [place].  - Recommend aggressive risk factor modifications going forward with good management of her HTN and routine monitoring for HLD and DM type 2.   2. HTN: BP well controlled. She does report occasional dizziness in the afternoons but SBP never falls below 110s on home checks when dizziness occurs. She reports this happens more frequently on days when she does not leave the house, raising the question of possible anxiety contributing.  - Would continue current blood pressure regimen of amlodipine, HCTZ, and quinapril.   3. GERD: notes some acid reflux symptoms  over the past 2 weeks. Not on any acid reducing medications.  - Recommended OTC pepcid BID x2 weeks then she can take as needed going forward.    Current medicines are reviewed at length with the patient today.  The patient does not have concerns regarding medicines.  The following changes have been made:  Recommended OTC pepcid for management of acid reflux  Labs/ tests ordered today include:  No orders of the defined types were placed in this encounter.    Disposition:   FU with Dr. Eldridge DaceVaranasi as needed.   Signed, Beatriz StallionKrista M. Melaine Mcphee, PA-C  04/02/2018 12:29 PM

## 2018-04-03 ENCOUNTER — Ambulatory Visit: Payer: Medicare Other | Admitting: Physician Assistant

## 2018-04-22 ENCOUNTER — Other Ambulatory Visit: Payer: Self-pay | Admitting: Family Medicine

## 2018-04-22 DIAGNOSIS — R1031 Right lower quadrant pain: Secondary | ICD-10-CM

## 2018-04-29 ENCOUNTER — Ambulatory Visit
Admission: RE | Admit: 2018-04-29 | Discharge: 2018-04-29 | Disposition: A | Discharge status: Home / Self Care | Payer: Medicare Other | Source: Ambulatory Visit | Attending: Family Medicine | Admitting: Family Medicine

## 2018-04-29 DIAGNOSIS — R1031 Right lower quadrant pain: Secondary | ICD-10-CM

## 2018-04-29 MED ORDER — IOPAMIDOL (ISOVUE-300) INJECTION 61%
100.0000 mL | Freq: Once | INTRAVENOUS | Status: AC | PRN
  Administered 2018-04-29: 100 mL via INTRAVENOUS

## 2018-08-04 ENCOUNTER — Telehealth: Payer: Self-pay

## 2018-08-04 NOTE — Telephone Encounter (Signed)
I contacted the pt and left second vm for pt regarding her 08/05/18 appt. Melissa Arnold left first vm on 08/03/18. Pt has been removed from schedule and will wait for call back to reschedule.

## 2018-08-05 ENCOUNTER — Ambulatory Visit: Payer: Medicare Other | Admitting: Neurology

## 2019-04-08 ENCOUNTER — Emergency Department (HOSPITAL_COMMUNITY)
Admission: EM | Admit: 2019-04-08 | Discharge: 2019-04-08 | Disposition: A | Discharge status: Home / Self Care | Payer: Medicare Other | Attending: Emergency Medicine | Admitting: Emergency Medicine

## 2019-04-08 ENCOUNTER — Encounter (HOSPITAL_COMMUNITY): Payer: Self-pay

## 2019-04-08 DIAGNOSIS — I10 Essential (primary) hypertension: Secondary | ICD-10-CM | POA: Diagnosis not present

## 2019-04-08 DIAGNOSIS — R55 Syncope and collapse: Secondary | ICD-10-CM | POA: Diagnosis not present

## 2019-04-08 DIAGNOSIS — Y9389 Activity, other specified: Secondary | ICD-10-CM | POA: Insufficient documentation

## 2019-04-08 DIAGNOSIS — Y999 Unspecified external cause status: Secondary | ICD-10-CM | POA: Diagnosis not present

## 2019-04-08 DIAGNOSIS — W010XXA Fall on same level from slipping, tripping and stumbling without subsequent striking against object, initial encounter: Secondary | ICD-10-CM | POA: Insufficient documentation

## 2019-04-08 DIAGNOSIS — Y92002 Bathroom of unspecified non-institutional (private) residence single-family (private) house as the place of occurrence of the external cause: Secondary | ICD-10-CM | POA: Diagnosis not present

## 2019-04-08 DIAGNOSIS — Z79899 Other long term (current) drug therapy: Secondary | ICD-10-CM | POA: Diagnosis not present

## 2019-04-08 DIAGNOSIS — W19XXXA Unspecified fall, initial encounter: Secondary | ICD-10-CM

## 2019-04-08 LAB — URINALYSIS, ROUTINE W REFLEX MICROSCOPIC
Bilirubin Urine: NEGATIVE
Glucose, UA: NEGATIVE mg/dL
Hgb urine dipstick: NEGATIVE
Ketones, ur: NEGATIVE mg/dL
Leukocytes,Ua: NEGATIVE
Nitrite: NEGATIVE
Protein, ur: NEGATIVE mg/dL
Specific Gravity, Urine: 1.004 — ABNORMAL LOW (ref 1.005–1.030)
pH: 7 (ref 5.0–8.0)

## 2019-04-08 LAB — BASIC METABOLIC PANEL
Anion gap: 12 (ref 5–15)
BUN: 21 mg/dL (ref 8–23)
CO2: 26 mmol/L (ref 22–32)
Calcium: 9.5 mg/dL (ref 8.9–10.3)
Chloride: 99 mmol/L (ref 98–111)
Creatinine, Ser: 1.07 mg/dL — ABNORMAL HIGH (ref 0.44–1.00)
GFR calc Af Amer: 56 mL/min — ABNORMAL LOW (ref >60)
GFR calc non Af Amer: 48 mL/min — ABNORMAL LOW (ref >60)
Glucose, Bld: 184 mg/dL — ABNORMAL HIGH (ref 70–99)
Potassium: 3.2 mmol/L — ABNORMAL LOW (ref 3.5–5.1)
Sodium: 137 mmol/L (ref 135–145)

## 2019-04-08 LAB — CBC
HCT: 45 % (ref 36.0–46.0)
Hemoglobin: 14 g/dL (ref 12.0–15.0)
MCH: 25.5 pg — ABNORMAL LOW (ref 26.0–34.0)
MCHC: 31.1 g/dL (ref 30.0–36.0)
MCV: 82.1 fL (ref 80.0–100.0)
Platelets: 267 10*3/uL (ref 150–400)
RBC: 5.48 MIL/uL — ABNORMAL HIGH (ref 3.87–5.11)
RDW: 14.5 % (ref 11.5–15.5)
WBC: 14.9 10*3/uL — ABNORMAL HIGH (ref 4.0–10.5)
nRBC: 0 % (ref 0.0–0.2)

## 2019-04-08 LAB — CBG MONITORING, ED: Glucose-Capillary: 174 mg/dL — ABNORMAL HIGH (ref 70–99)

## 2019-04-08 MED ORDER — SODIUM CHLORIDE 0.9 % IV BOLUS
500.0000 mL | Freq: Once | INTRAVENOUS | Status: AC
  Administered 2019-04-08: 16:00:00 500 mL via INTRAVENOUS

## 2019-04-08 MED ORDER — SODIUM CHLORIDE 0.9% FLUSH
3.0000 mL | Freq: Once | INTRAVENOUS | Status: AC
  Administered 2019-04-08: 3 mL via INTRAVENOUS

## 2019-04-08 NOTE — ED Triage Notes (Signed)
Patient arrived via [facility] from home.   Patient reports syncopal episode around 12:20 pm today.   C/o lightheadedness since waking up this morning. Patient stood up from the table and lightheadedness got worse felt like she had to go to the bathroom. Walked towards the bathroom and had syncopal episode.   No injuries from fall.  Patient unsure if she hit head.  Patient not c/o of pain   Patient denies taking a blood thinner.    First BP- 90/62  New BP- 147/81  No fluids given by ems.   Patient reports issues with BP going up and down   Patient was recently placed on verapamil 2 weeks ago. Patient takes medication at night.   A/ox4 Ambulatory with ems at home.   Hx. Chronic lower back pain   Denies chest pain today but reports random right chest pain and PCP is aware.

## 2019-04-08 NOTE — ED Notes (Signed)
Pt aware urine sample needed and to use call light to get help when she feels the need to go.

## 2019-04-08 NOTE — ED Notes (Signed)
Pt assisted to bedside toilet. Urine sample unable to be obtained. Pt had one episode of small BM and urine.

## 2019-04-08 NOTE — Discharge Instructions (Addendum)
You were seen in the emergency department after a near fainting spell at home.  You had blood work that did not show any serious findings.  Your heart rate was elevated and you were given some IV fluids.  This may be related to the new blood pressure medicine that you are on and will be important for you to contact your primary care doctor regarding this.  You should probably hold that medication until you can be reevaluated.  Keep well-hydrated.  Return to the emergency department if any worsening or concerning symptoms.

## 2019-04-08 NOTE — ED Notes (Signed)
Pt verbalizes understanding of DC instructions. Pt belongings returned and is ambulatory out of ED.  

## 2019-04-08 NOTE — ED Notes (Signed)
Patient denies LOC. Patient states she just got dizzy and fell. MD made aware.

## 2019-04-08 NOTE — ED Notes (Signed)
Jaleigha Deane, daughter wants an update on her mother, [phone].

## 2019-04-08 NOTE — ED Provider Notes (Signed)
[facility]-EMERGENCY DEPT Provider Note   CSN: [id number] Arrival date & time: 04/08/19  1314     History Chief Complaint  Patient presents with  . Fall    Melissa Arnold is a 86 y.o. female.  She was recently started on diltiazem due to her blood pressure being elevated in the setting of going through a course of prednisone.  She said she was getting up from the table and going towards the bathroom when she felt acutely lightheaded and fell to the floor.  She denies loss of consciousness.  Reportedly her daughter said she was initially confused but that cleared quickly.  EMS found her blood pressure to be 90 although it was 140 by the time she got here.  She denies any complaints.  She denies any injuries from the fall.  She is not on anticoagulation.  The history is provided by the patient.  Fall This is a new problem. The current episode started less than 1 hour ago. The problem occurs rarely. The problem has been resolved. Pertinent negatives include no chest pain, no abdominal pain, no headaches and no shortness of breath. Nothing aggravates the symptoms. Nothing relieves the symptoms. She has tried nothing for the symptoms. The treatment provided moderate relief.       Past Medical History:  Diagnosis Date  . Allergy   . Arthritis   . Cataract   . GERD (gastroesophageal reflux disease)   . Hypertension   . Osteoporosis   . Vitamin D deficiency     Patient Active Problem List   Diagnosis Date Noted  . Chest pain 03/17/2018  . Obesity, Class III, BMI 40-49.9 (morbid obesity) (HCC)   . Hypertension 05/20/2017  . Arthritis of knee 05/20/2017  . Vertigo 07/26/2016  . Acute allergic rhinitis 12/20/2015    Past Surgical History:  Procedure Laterality Date  . CHOLECYSTECTOMY    . EYE SURGERY       OB History   No obstetric history on file.     Family History  Problem Relation Age of Onset  . Hypertension Father   . Heart failure Father    . Heart disease Brother   . Heart failure Mother   . Asthma Mother   . Heart attack Neg Hx   . Stroke Neg Hx     Social History   Tobacco Use  . Smoking status: Never Smoker  . Smokeless tobacco: Never Used  Substance Use Topics  . Alcohol use: No    Alcohol/week: 0.0 standard drinks  . Drug use: No    Home Medications Prior to Admission medications   Medication Sig Start Date End Date Taking? Authorizing Provider  acetaminophen (TYLENOL) 500 MG tablet Take 1,000 mg by mouth every 6 (six) hours as needed for mild pain.    [provider]  amLODipine (NORVASC) 5 MG tablet Take 5 mg by mouth daily. 04/28/17   [provider]  Aspirin-Salicylamide-Caffeine (BC HEADACHE POWDER PO) Take 1 each by mouth daily as needed (pain).    [provider]  guaifenesin (ROBITUSSIN) 100 MG/5ML syrup Take 100 mg by mouth 3 (three) times daily as needed for cough.    [provider]  hydrochlorothiazide (HYDRODIURIL) 25 MG tablet Take 25 mg by mouth daily.    [provider]  montelukast (SINGULAIR) 10 MG tablet Take 10 mg by mouth every evening. 06/27/16   [provider]  Multiple Vitamins-Minerals (MULTIVITAMIN WOMEN 50+) TABS Take 1 tablet by mouth  daily.    [provider]  quinapril (ACCUPRIL) 20 MG tablet Take 20 mg by mouth daily.  03/16/15   [provider]  sodium-potassium bicarbonate (ALKA-SELTZER GOLD) TBEF dissolvable tablet Take 1 tablet by mouth daily as needed (heartburn).    [provider]  traMADol (ULTRAM) 50 MG tablet Take 50 mg by mouth 3 (three) times daily as needed for moderate pain.  06/27/16   [provider]    Allergies    Gabapentin  Review of Systems   Review of Systems  Constitutional: Negative for fever.  HENT: Negative for sore throat.   Eyes: Negative for visual disturbance.  Respiratory: Negative for shortness of breath.   Cardiovascular: Negative for chest pain.   Gastrointestinal: Negative for abdominal pain.  Genitourinary: Negative for dysuria.  Musculoskeletal: Positive for back pain (chronic).  Skin: Negative for wound.  Neurological: Negative for headaches.    Physical Exam Updated Vital Signs BP 129/89   Pulse (!) 133   Temp 98.1 F (36.7 C) (Oral)   Resp (!) 27   SpO2 100%   Physical Exam Vitals and nursing note reviewed.  Constitutional:      General: She is not in acute distress.    Appearance: She is well-developed.  HENT:     Head: Normocephalic and atraumatic.  Eyes:     Conjunctiva/sclera: Conjunctivae normal.  Cardiovascular:     Rate and Rhythm: Normal rate and regular rhythm.     Pulses: Normal pulses.     Heart sounds: No murmur.  Pulmonary:     Effort: Pulmonary effort is normal. No respiratory distress.     Breath sounds: Normal breath sounds.  Abdominal:     Palpations: Abdomen is soft.     Tenderness: There is no abdominal tenderness.  Musculoskeletal:        General: No deformity. Normal range of motion.     Cervical back: Neck supple.     Right lower leg: No edema.     Left lower leg: No edema.  Skin:    General: Skin is warm and dry.     Capillary Refill: Capillary refill takes less than 2 seconds.  Neurological:     General: No focal deficit present.     Mental Status: She is alert.     ED Results / Procedures / Treatments   Labs (all labs ordered are listed, but only abnormal results are displayed) Labs Reviewed  BASIC METABOLIC PANEL - Abnormal; Notable for the following components:      Result Value   Potassium 3.2 (*)    Glucose, Bld 184 (*)    Creatinine, Ser 1.07 (*)    GFR calc non Af Amer 48 (*)    GFR calc Af Amer 56 (*)    All other components within normal limits  CBC - Abnormal; Notable for the following components:   WBC 14.9 (*)    RBC 5.48 (*)    MCH 25.5 (*)    All other components within normal limits  URINALYSIS, ROUTINE W REFLEX MICROSCOPIC - Abnormal; Notable for  the following components:   Color, Urine STRAW (*)    Specific Gravity, Urine 1.004 (*)    All other components within normal limits  CBG MONITORING, ED - Abnormal; Notable for the following components:   Glucose-Capillary 174 (*)    All other components within normal limits    EKG EKG Interpretation  Date/Time:  Thursday April 08 2019 13:43:33 EST Ventricular Rate:  58  PR Interval:    QRS Duration: 91 QT Interval:  353 QTC Calculation: 425 R Axis:   -37 Text Interpretation: Sinus rhythm LVH with secondary repolarization abnormality Anterior Q waves, possibly due to LVH No significant change since 12/19 Confirmed by Aletta Edouard [id number] on 04/08/2019 1:45:48 PM   Radiology No results found.  Procedures Procedures (including critical care time)  Medications Ordered in ED Medications  sodium chloride flush (NS) 0.9 % injection 3 mL (3 mLs Intravenous Given 04/08/19 1358)  sodium chloride 0.9 % bolus 500 mL (500 mLs Intravenous New Bag/Given 04/08/19 1619)    ED Course  I have reviewed the triage vital signs and the nursing notes.  Pertinent labs & imaging results that were available during my care of the patient were reviewed by me and considered in my medical decision making (see chart for details).  Clinical Course as of Apr 07 1633  Thu Apr 08, 7727  3185 86 year old female here with what sounds like a presyncopal event where she fell after becoming acutely lightheaded standing up.  Will check blood work to ensure not anemic.  She said she started a new blood pressure medicine so possibly she had a episode of hypotension.  Continue cardiac monitor to eval for arrhythmia.   [MB]  1430 Nurses documenting some elevated heart rates although when I went to check on the patient she was in sinus at a rate of 100.   [MB]  5784 Patient had orthostatic vital signs done.  Her pressure remained good although her heart rate jumped up.  I have ordered her some IV fluids.   [MB]     Clinical Course User Index [MB] Hayden Rasmussen, MD   MDM Rules/Calculators/A&P                      Final Clinical Impression(s) / ED Diagnoses Final diagnoses:  Near syncope  Fall, initial encounter    Rx / DC Orders ED Discharge Orders    None       Hayden Rasmussen, MD 04/08/19 1635

## 2019-05-20 ENCOUNTER — Other Ambulatory Visit: Payer: Medicare Other

## 2019-05-23 ENCOUNTER — Ambulatory Visit: Payer: Medicare Other | Attending: Internal Medicine

## 2019-05-23 DIAGNOSIS — Z23 Encounter for immunization: Secondary | ICD-10-CM

## 2019-05-23 NOTE — Progress Notes (Signed)
   Covid-19 Vaccination Clinic  Name:  Melissa Arnold    MRN: [id number] DOB: Oct 12, 1933  05/23/2019  Melissa Arnold was observed post Covid-19 immunization for 15 minutes without incident. She was provided with Vaccine Information Sheet and instruction to access the V-Safe system.   Melissa Arnold was instructed to call 911 with any severe reactions post vaccine: Marland Kitchen Difficulty breathing  . Swelling of face and throat  . A fast heartbeat  . A bad rash all over body  . Dizziness and weakness   Immunizations Administered    Name Date Dose VIS Date Route   Pfizer COVID-19 Vaccine 05/23/2019  4:34 PM 0.3 mL 02/26/2019 Intramuscular   Manufacturer: [employer], [employer]   Lot: [id number]   NDC: [address]-2

## 2019-06-23 ENCOUNTER — Ambulatory Visit: Payer: Medicare Other | Attending: Internal Medicine

## 2019-06-23 DIAGNOSIS — Z23 Encounter for immunization: Secondary | ICD-10-CM

## 2019-06-23 NOTE — Progress Notes (Signed)
   Covid-19 Vaccination Clinic  Name:  Melissa Arnold    MRN: [id number] DOB: [birth year]  06/23/2019  [name] was observed post Covid-19 immunization for 15 minutes without incident. She was provided with Vaccine Information Sheet and instruction to access the V-Safe system.   [name] was instructed to call 911 with any severe reactions post vaccine: Marland Kitchen Difficulty breathing  . Swelling of face and throat  . A fast heartbeat  . A bad rash all over body  . Dizziness and weakness   Immunizations Administered    Name Date Dose VIS Date Route   Pfizer COVID-19 Vaccine 06/23/2019  2:24 PM 0.3 mL 02/26/2019 Intramuscular   Manufacturer: [employer], [employer]   Lot: [id number]   NDC: [address]-4

## 2020-03-25 ENCOUNTER — Ambulatory Visit: Payer: Medicare Other | Attending: Internal Medicine

## 2020-03-25 DIAGNOSIS — Z23 Encounter for immunization: Secondary | ICD-10-CM

## 2020-03-25 NOTE — Progress Notes (Signed)
   Covid-19 Vaccination Clinic  Name:  Melissa Arnold    MRN: [id number] DOB: [birth year]  03/25/2020  Melissa Arnold was observed post Covid-19 immunization for 15 minutes without incident. She was provided with Vaccine Information Sheet and instruction to access the V-Safe system.   Melissa Arnold was instructed to call 911 with any severe reactions post vaccine: Marland Kitchen Difficulty breathing  . Swelling of face and throat  . A fast heartbeat  . A bad rash all over body  . Dizziness and weakness   Immunizations Administered    Name Date Dose VIS Date Route   Pfizer COVID-19 Vaccine 03/25/2020 11:32 AM 0.3 mL 01/05/2020 Intramuscular   Manufacturer: [employer], [employer]   Lot: [id number]   NDC: [address]-6

## 2020-04-17 DIAGNOSIS — M1712 Unilateral primary osteoarthritis, left knee: Secondary | ICD-10-CM | POA: Diagnosis not present

## 2020-04-17 DIAGNOSIS — G8929 Other chronic pain: Secondary | ICD-10-CM | POA: Diagnosis not present

## 2020-04-17 DIAGNOSIS — M545 Low back pain, unspecified: Secondary | ICD-10-CM | POA: Diagnosis not present

## 2020-04-17 DIAGNOSIS — M069 Rheumatoid arthritis, unspecified: Secondary | ICD-10-CM | POA: Diagnosis not present

## 2020-04-17 DIAGNOSIS — Z79899 Other long term (current) drug therapy: Secondary | ICD-10-CM | POA: Diagnosis not present

## 2020-04-17 DIAGNOSIS — I10 Essential (primary) hypertension: Secondary | ICD-10-CM | POA: Diagnosis not present

## 2020-04-17 DIAGNOSIS — R0789 Other chest pain: Secondary | ICD-10-CM | POA: Diagnosis not present

## 2020-04-17 DIAGNOSIS — R059 Cough, unspecified: Secondary | ICD-10-CM | POA: Diagnosis not present

## 2021-01-29 ENCOUNTER — Other Ambulatory Visit: Payer: Self-pay | Admitting: Endocrinology

## 2021-01-29 DIAGNOSIS — Z1231 Encounter for screening mammogram for malignant neoplasm of breast: Secondary | ICD-10-CM

## 2021-02-09 IMAGING — CT CT ABD-PELV W/ CM
1 of 3 series · 13 of 32 positions shown, 19 images · IV contrast (APPLIED)
Comparison: None.

CLINICAL DATA: Right lower quadrant pain

EXAM:
CT ABDOMEN AND PELVIS WITH CONTRAST
TECHNIQUE: Multidetector CT imaging of the abdomen and pelvis was performed
using the standard protocol following bolus administration of
intravenous contrast.
CONTRAST:  100mL [id number] IOPAMIDOL (GPY6IA-ZLL) INJECTION 61%.
Distal oral enteric contrast.

[Series 2: abd/pelvis w/cm · axial · 0.88mm/px · z∈[-411,-1]mm · 13 of 96 slices shown, 19 images]
[im 7/96  soft-tissue]
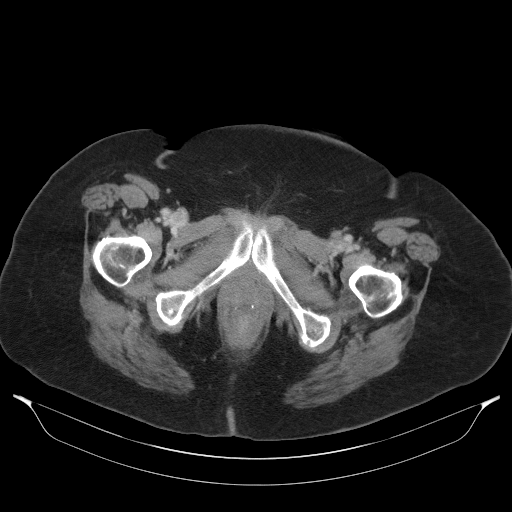
[im 7/96  bone]
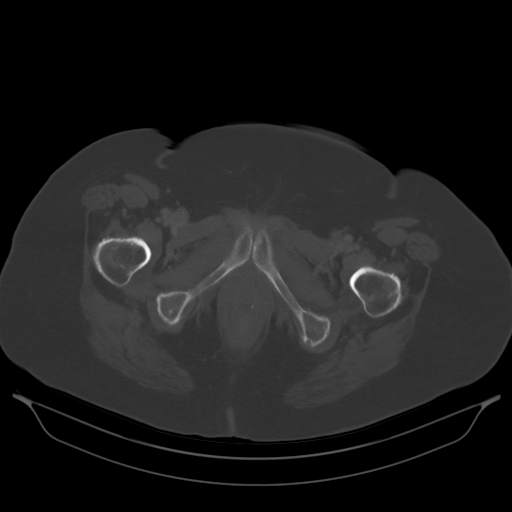
[im 14/96  soft-tissue]
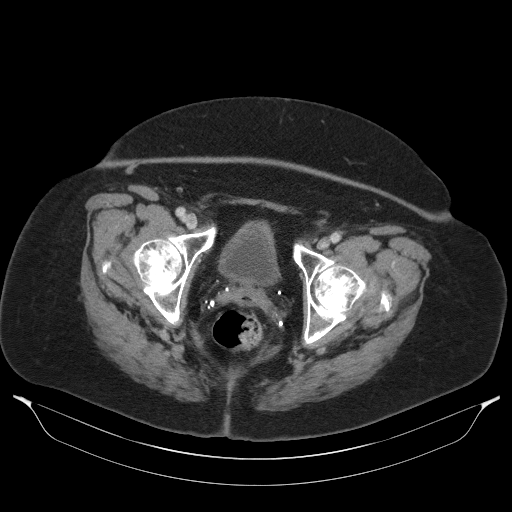
[im 21/96  soft-tissue]
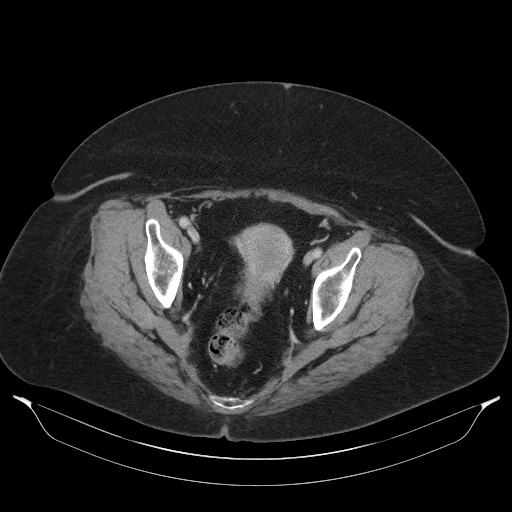
[im 28/96  soft-tissue]
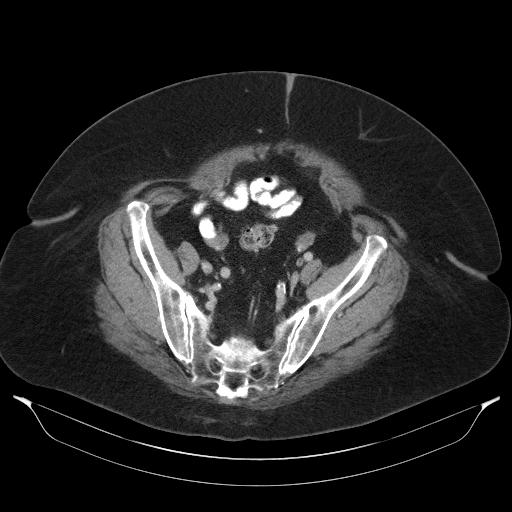
[im 34/96  soft-tissue]
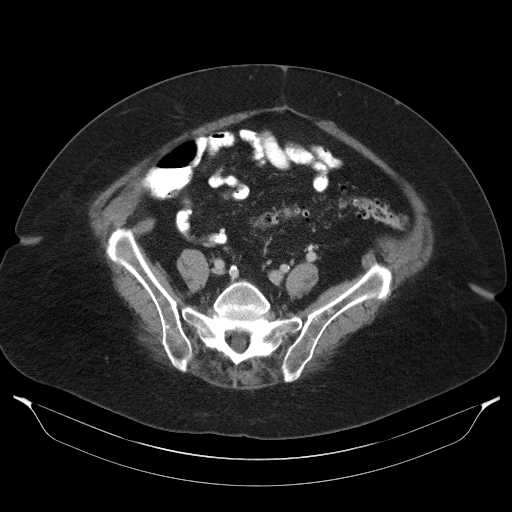
[im 41/96  soft-tissue]
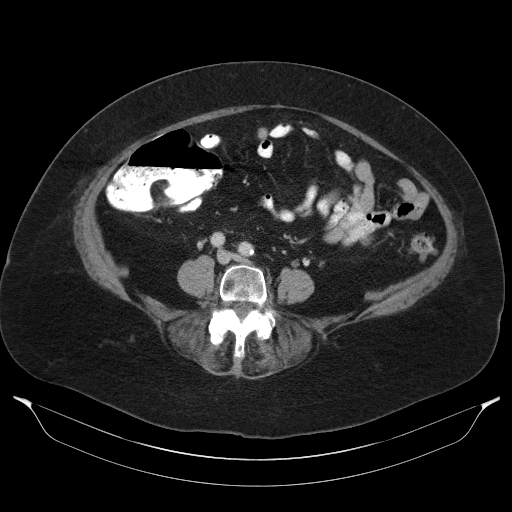
[im 48/96  soft-tissue]
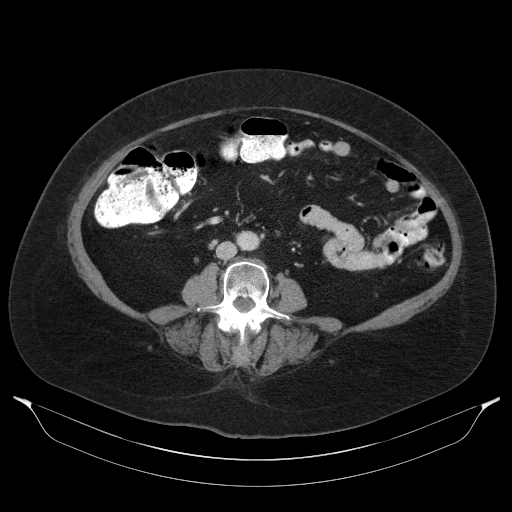
[im 55/96  soft-tissue]
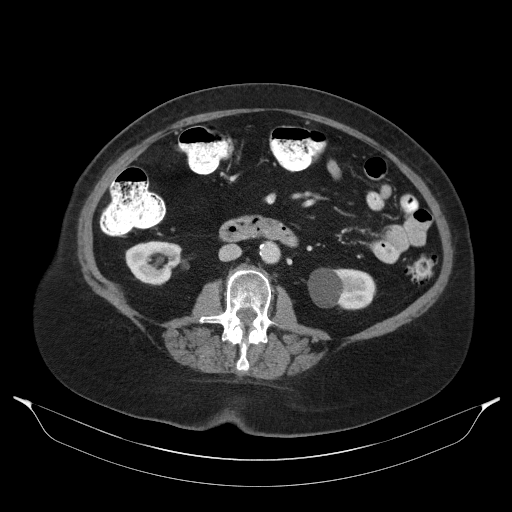
[im 62/96  soft-tissue]
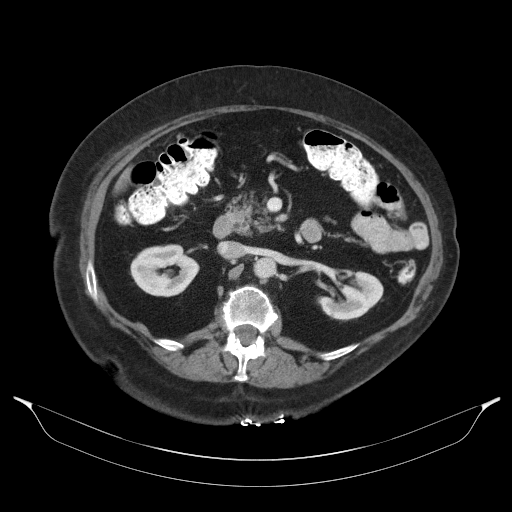
[im 62/96  bone]
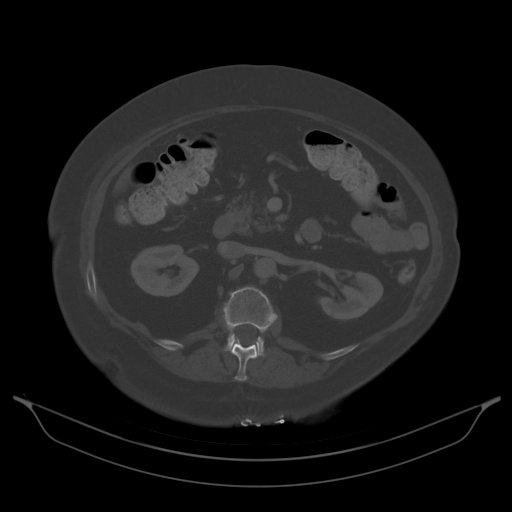
[im 68/96  soft-tissue]
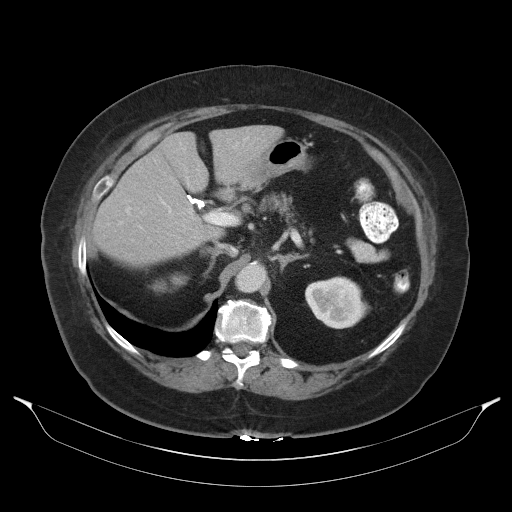
[im 68/96  lung]
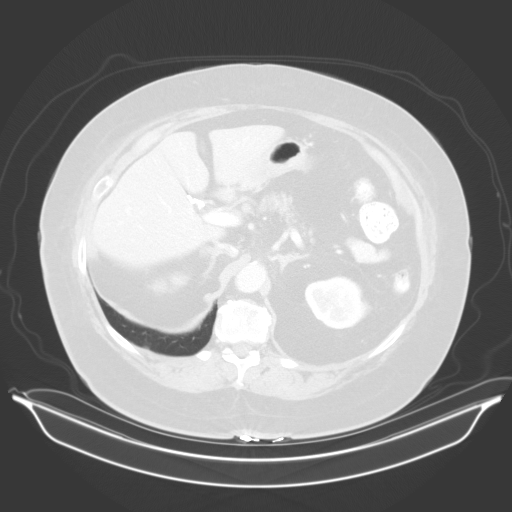
[im 75/96  soft-tissue]
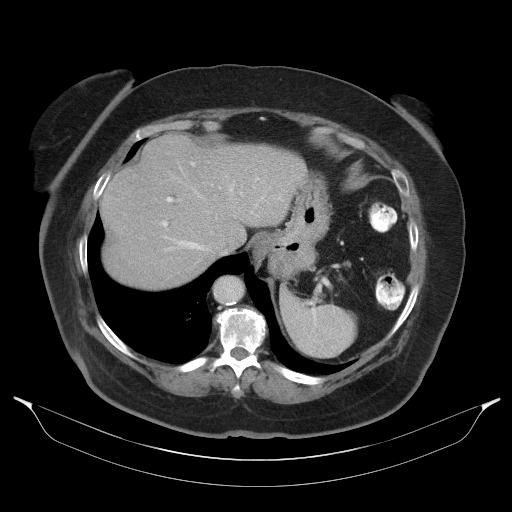
[im 75/96  lung]
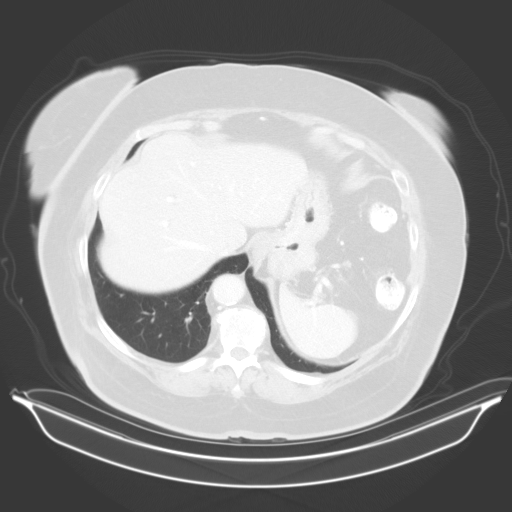
[im 82/96  soft-tissue]
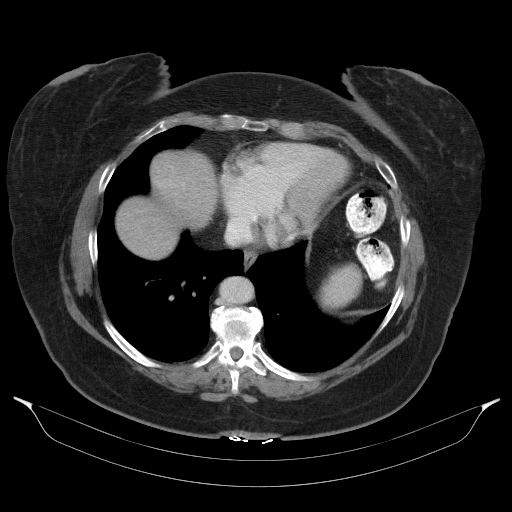
[im 82/96  lung]
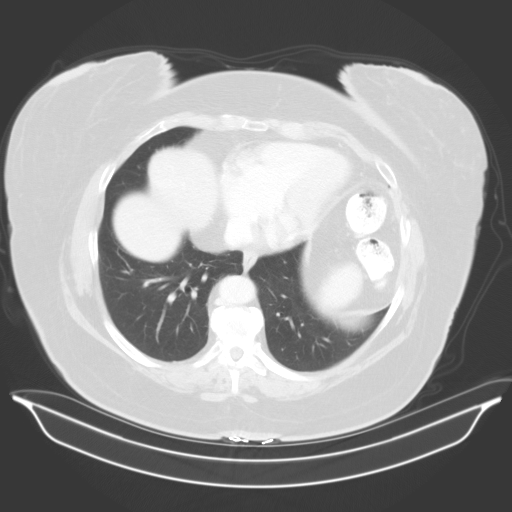
[im 89/96  soft-tissue]
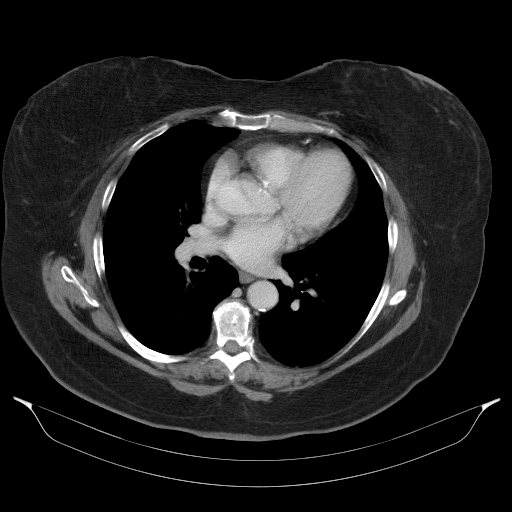
[im 89/96  lung]
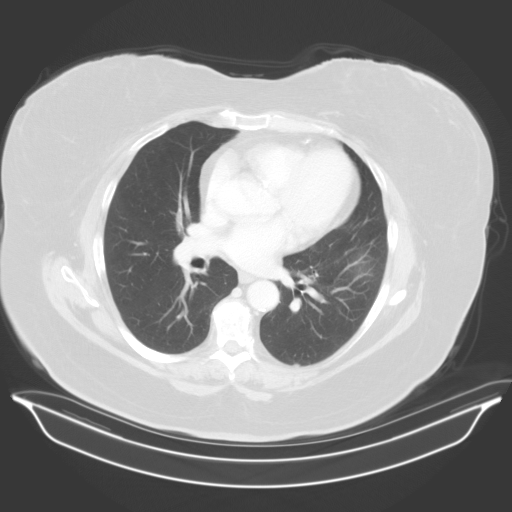

[13 of 32 positions shown; findings below may reference images not displayed]

FINDINGS: Lower chest: No acute abnormality. Extensive 3 vessel coronary
artery calcifications.

Hepatobiliary: No focal liver abnormality is seen. Status post
cholecystectomy. No biliary dilatation.

Pancreas: Unremarkable. No pancreatic ductal dilatation or
surrounding inflammatory changes.

Spleen: Normal in size without focal abnormality.

Adrenals/Urinary Tract: Adrenal glands are unremarkable. Kidneys are
normal, without renal calculi, focal lesion, or hydronephrosis.
Bladder is unremarkable.

Stomach/Bowel: Stomach is within normal limits. Appendix appears
normal. No evidence of bowel wall thickening, distention, or
inflammatory changes. Severe descending and sigmoid diverticulosis
without evidence of acute diverticulitis.

Vascular/Lymphatic: Calcific atherosclerosis. No enlarged abdominal
or pelvic lymph nodes.

Reproductive: No mass or other abnormality.

Other: No abdominal wall hernia or abnormality. No abdominopelvic
ascites.

Musculoskeletal: No acute or significant osseous findings.
IMPRESSION: 1.  No CT findings to explain right lower quadrant abdominal pain.

2.  Normal appendix.

3. Severe descending and sigmoid diverticulosis without evidence of
acute diverticulitis.
